# Patient Record
Sex: Male | Born: 1935 | Race: Black or African American | Hispanic: No | Marital: Single | Smoking: Former smoker
Health system: Southern US, Community
[De-identification: ages and names within clinical notes are randomized; demographics above are authoritative.]

## PROBLEM LIST (undated history)

## (undated) DIAGNOSIS — F0390 Unspecified dementia without behavioral disturbance: Secondary | ICD-10-CM

## (undated) DIAGNOSIS — N179 Acute kidney failure, unspecified: Secondary | ICD-10-CM

## (undated) DIAGNOSIS — I10 Essential (primary) hypertension: Secondary | ICD-10-CM

## (undated) DIAGNOSIS — I2510 Atherosclerotic heart disease of native coronary artery without angina pectoris: Secondary | ICD-10-CM

## (undated) HISTORY — DX: Acute kidney failure, unspecified: N17.9

## (undated) HISTORY — DX: Unspecified dementia, unspecified severity, without behavioral disturbance, psychotic disturbance, mood disturbance, and anxiety: F03.90

## (undated) HISTORY — DX: Essential (primary) hypertension: I10

## (undated) HISTORY — DX: Atherosclerotic heart disease of native coronary artery without angina pectoris: I25.10

---

## 2006-11-01 ENCOUNTER — Ambulatory Visit: Payer: Self-pay | Admitting: Cardiology

## 2006-11-09 ENCOUNTER — Ambulatory Visit (HOSPITAL_COMMUNITY): Admission: RE | Admit: 2006-11-09 | Discharge: 2006-11-09 | Payer: Self-pay | Admitting: Urology

## 2006-11-10 ENCOUNTER — Ambulatory Visit (HOSPITAL_COMMUNITY): Admission: RE | Admit: 2006-11-10 | Discharge: 2006-11-10 | Payer: Self-pay | Admitting: Urology

## 2009-10-14 ENCOUNTER — Ambulatory Visit (HOSPITAL_COMMUNITY): Admission: RE | Admit: 2009-10-14 | Discharge: 2009-10-14 | Payer: Self-pay | Admitting: Urology

## 2010-04-16 ENCOUNTER — Ambulatory Visit (HOSPITAL_COMMUNITY): Admission: RE | Admit: 2010-04-16 | Discharge: 2010-04-16 | Payer: Self-pay | Admitting: Urology

## 2010-10-20 ENCOUNTER — Ambulatory Visit (HOSPITAL_COMMUNITY)
Admission: RE | Admit: 2010-10-20 | Discharge: 2010-10-20 | Payer: Self-pay | Source: Home / Self Care | Attending: Urology | Admitting: Urology

## 2010-10-21 ENCOUNTER — Ambulatory Visit (HOSPITAL_COMMUNITY)
Admission: RE | Admit: 2010-10-21 | Discharge: 2010-10-21 | Payer: Self-pay | Source: Home / Self Care | Attending: Urology | Admitting: Urology

## 2010-11-03 ENCOUNTER — Other Ambulatory Visit (HOSPITAL_COMMUNITY): Payer: Self-pay | Admitting: Urology

## 2010-11-03 ENCOUNTER — Ambulatory Visit (HOSPITAL_COMMUNITY)
Admission: RE | Admit: 2010-11-03 | Discharge: 2010-11-03 | Disposition: A | Discharge status: Home / Self Care | Payer: Medicare Other | Source: Ambulatory Visit | Attending: Urology | Admitting: Urology

## 2010-11-03 DIAGNOSIS — R109 Unspecified abdominal pain: Secondary | ICD-10-CM | POA: Insufficient documentation

## 2010-11-03 DIAGNOSIS — N200 Calculus of kidney: Secondary | ICD-10-CM

## 2010-11-03 DIAGNOSIS — N201 Calculus of ureter: Secondary | ICD-10-CM | POA: Insufficient documentation

## 2010-11-18 ENCOUNTER — Ambulatory Visit (HOSPITAL_COMMUNITY)
Admission: RE | Admit: 2010-11-18 | Discharge: 2010-11-18 | Disposition: A | Discharge status: Home / Self Care | Payer: Medicare Other | Source: Ambulatory Visit | Attending: Urology | Admitting: Urology

## 2010-11-18 ENCOUNTER — Other Ambulatory Visit (HOSPITAL_COMMUNITY): Payer: Self-pay | Admitting: Urology

## 2010-11-18 DIAGNOSIS — Z09 Encounter for follow-up examination after completed treatment for conditions other than malignant neoplasm: Secondary | ICD-10-CM | POA: Insufficient documentation

## 2010-11-18 DIAGNOSIS — N201 Calculus of ureter: Secondary | ICD-10-CM | POA: Insufficient documentation

## 2010-12-17 ENCOUNTER — Other Ambulatory Visit (HOSPITAL_COMMUNITY): Payer: Self-pay | Admitting: Urology

## 2010-12-17 ENCOUNTER — Ambulatory Visit (HOSPITAL_COMMUNITY)
Admission: RE | Admit: 2010-12-17 | Discharge: 2010-12-17 | Disposition: A | Discharge status: Home / Self Care | Payer: Medicare Other | Source: Ambulatory Visit | Attending: Urology | Admitting: Urology

## 2010-12-17 DIAGNOSIS — Z9889 Other specified postprocedural states: Secondary | ICD-10-CM

## 2010-12-17 DIAGNOSIS — Z09 Encounter for follow-up examination after completed treatment for conditions other than malignant neoplasm: Secondary | ICD-10-CM | POA: Insufficient documentation

## 2010-12-17 DIAGNOSIS — N200 Calculus of kidney: Secondary | ICD-10-CM

## 2011-02-12 NOTE — H&P (Signed)
Joe Anderson, Joe Anderson                ACCOUNT NO.:  [id number]   MEDICAL RECORD NO.:  [id number]          PATIENT TYPE:  AMB   LOCATION:                                FACILITY:  [facility]   PHYSICIAN:  Dennie Maizes, M.D.   DATE OF BIRTH:  1928/09/11   DATE OF ADMISSION:  11/09/2006  DATE OF DISCHARGE:  LH                              HISTORY & PHYSICAL   CHIEF COMPLAINT:  Left flank pain, right ureteral calculus with  obstruction, bilateral renal calculi.   HISTORY OF PRESENT ILLNESS:  This 77 year old male is known to me from  prior evaluation and treatment.  He has a history of recurrent  urolithiasis.  He has undergone extracorporeal shock wave lithotripsy of  A large right renal calculus in 2001.   He came to the emergency room at [facility] with  intermittent left flank pain about 10 days ago.  Urinalysis revealed  microhematuria.  A noncontrast CT scan of the abdomen and pelvis  revealed bilateral large renal calculi without obstruction.  There was a  6.2-cm sized peripelvic cyst on the right kidney.  There was a 4.7-cm  sized left upper pole renal cyst with calcifications.  The patient had a  5-mm sized proximal left ureteral calculus with obstruction.  There was  also a 6-mm sized right upper ureteral calculus with obstruction and  hydronephrosis.  The patient was unable to pass the ureteral calculi.  He was taken to the O.R. on November 01, 2006.  Cystoscopy and bilateral  retrograde pyelograms were done.  There was a right upper ureteral  calculus with obstruction.  Right ureteral stent was placed.  The  patient was found to have passed the left ureteral calculus into the  bladder.  The bladder calculus was removed.  The patient is brought to  the short-stay center today for extracorporeal shock wave lithotripsy of  the obstructing right upper ureteral calculus.   PAST MEDICAL HISTORY:  1. History of scoliosis.  2. Recurrent urolithiasis.  3. Status post  right ureteral lithotomy 37 years ago, status post ESWL      of right renal calculus in 2001.   ALLERGIES:  The patient is allergic to IV CONTRAST.   FAMILY HISTORY:  Positive for hypertension, diabetes mellitus, recurrent  urolithiasis and prostate cancer.   EXAMINATION:  HEENT:  Normal.  NECK:  No masses.  LUNGS:  Clear to auscultation.  HEART:  Regular rate and rhythm.  No murmurs.  ABDOMEN:  Soft.  No palpable flank mass.  There is no costovertebral  angle tenderness.  Umbilical hernia is noted.  GENITOURINARY:  Penis and testes are normal.  RECTAL EXAMINATION:  Fifty-gram size benign prostate.   IMPRESSION:  Right ureteral calculus with obstruction, right  hydronephrosis, bilateral renal calculi,  bilateral renal cysts, status  post right ureteral stent placement.   PLAN:  Extracorporeal shock wave lithotripsy of the 6-mm sized right  ureteral calculus under IV sedation in the hospital.  I have informed  the patient regarding the diagnosis, operative details, alternative  treatments, outcome, possible risks and complications, and  he has agreed  for the procedure to be done.  The patient has multiple stones, and he  may require more than one session of treatment of ESWL.      Dennie Maizes, M.D.  Electronically Signed     SK/MEDQ  D:  11/09/2006  T:  11/09/2006  Job:  [id number]   cc:   Lia Hopping  Fax: [phone]

## 2011-03-18 ENCOUNTER — Ambulatory Visit (HOSPITAL_COMMUNITY)
Admission: RE | Admit: 2011-03-18 | Discharge: 2011-03-18 | Disposition: A | Discharge status: Home / Self Care | Payer: Medicare Other | Source: Ambulatory Visit | Attending: Urology | Admitting: Urology

## 2011-03-18 ENCOUNTER — Other Ambulatory Visit (HOSPITAL_COMMUNITY): Payer: Self-pay | Admitting: Urology

## 2011-03-18 DIAGNOSIS — R109 Unspecified abdominal pain: Secondary | ICD-10-CM | POA: Insufficient documentation

## 2011-03-18 DIAGNOSIS — N201 Calculus of ureter: Secondary | ICD-10-CM

## 2011-03-18 DIAGNOSIS — N200 Calculus of kidney: Secondary | ICD-10-CM | POA: Insufficient documentation

## 2011-07-22 ENCOUNTER — Other Ambulatory Visit (HOSPITAL_COMMUNITY): Payer: Self-pay | Admitting: Urology

## 2011-07-22 ENCOUNTER — Ambulatory Visit (HOSPITAL_COMMUNITY)
Admission: RE | Admit: 2011-07-22 | Discharge: 2011-07-22 | Disposition: A | Discharge status: Home / Self Care | Payer: Medicare Other | Source: Ambulatory Visit | Attending: Urology | Admitting: Urology

## 2011-07-22 DIAGNOSIS — N200 Calculus of kidney: Secondary | ICD-10-CM

## 2011-07-22 DIAGNOSIS — R109 Unspecified abdominal pain: Secondary | ICD-10-CM | POA: Insufficient documentation

## 2011-12-23 ENCOUNTER — Other Ambulatory Visit (HOSPITAL_COMMUNITY): Payer: Self-pay | Admitting: Urology

## 2011-12-23 ENCOUNTER — Ambulatory Visit (HOSPITAL_COMMUNITY)
Admission: RE | Admit: 2011-12-23 | Discharge: 2011-12-23 | Disposition: A | Discharge status: Home / Self Care | Payer: Medicare Other | Source: Ambulatory Visit | Attending: Urology | Admitting: Urology

## 2011-12-23 DIAGNOSIS — N200 Calculus of kidney: Secondary | ICD-10-CM | POA: Insufficient documentation

## 2011-12-23 DIAGNOSIS — M4120 Other idiopathic scoliosis, site unspecified: Secondary | ICD-10-CM | POA: Insufficient documentation

## 2014-09-27 HISTORY — PX: PERCUTANEOUS CORONARY STENT INTERVENTION (PCI-S): SHX6016

## 2015-06-06 DIAGNOSIS — I2510 Atherosclerotic heart disease of native coronary artery without angina pectoris: Secondary | ICD-10-CM | POA: Insufficient documentation

## 2015-06-06 DIAGNOSIS — R55 Syncope and collapse: Secondary | ICD-10-CM | POA: Insufficient documentation

## 2015-12-03 DIAGNOSIS — R55 Syncope and collapse: Secondary | ICD-10-CM | POA: Diagnosis not present

## 2015-12-03 DIAGNOSIS — Z959 Presence of cardiac and vascular implant and graft, unspecified: Secondary | ICD-10-CM | POA: Diagnosis not present

## 2015-12-04 DIAGNOSIS — R55 Syncope and collapse: Secondary | ICD-10-CM | POA: Diagnosis not present

## 2015-12-04 DIAGNOSIS — Z959 Presence of cardiac and vascular implant and graft, unspecified: Secondary | ICD-10-CM | POA: Diagnosis not present

## 2015-12-19 DIAGNOSIS — R630 Anorexia: Secondary | ICD-10-CM | POA: Diagnosis not present

## 2015-12-19 DIAGNOSIS — E119 Type 2 diabetes mellitus without complications: Secondary | ICD-10-CM | POA: Diagnosis not present

## 2015-12-19 DIAGNOSIS — I10 Essential (primary) hypertension: Secondary | ICD-10-CM | POA: Diagnosis not present

## 2015-12-19 DIAGNOSIS — Z6826 Body mass index (BMI) 26.0-26.9, adult: Secondary | ICD-10-CM | POA: Diagnosis not present

## 2015-12-19 DIAGNOSIS — N182 Chronic kidney disease, stage 2 (mild): Secondary | ICD-10-CM | POA: Diagnosis not present

## 2016-01-06 DIAGNOSIS — M5136 Other intervertebral disc degeneration, lumbar region: Secondary | ICD-10-CM | POA: Diagnosis not present

## 2016-01-06 DIAGNOSIS — M4727 Other spondylosis with radiculopathy, lumbosacral region: Secondary | ICD-10-CM | POA: Diagnosis not present

## 2016-01-20 DIAGNOSIS — Z87442 Personal history of urinary calculi: Secondary | ICD-10-CM | POA: Diagnosis not present

## 2016-01-20 DIAGNOSIS — M4806 Spinal stenosis, lumbar region: Secondary | ICD-10-CM | POA: Diagnosis not present

## 2016-01-20 DIAGNOSIS — M47816 Spondylosis without myelopathy or radiculopathy, lumbar region: Secondary | ICD-10-CM | POA: Diagnosis not present

## 2016-01-20 DIAGNOSIS — I2510 Atherosclerotic heart disease of native coronary artery without angina pectoris: Secondary | ICD-10-CM | POA: Diagnosis not present

## 2016-01-20 DIAGNOSIS — Z79899 Other long term (current) drug therapy: Secondary | ICD-10-CM | POA: Diagnosis not present

## 2016-01-20 DIAGNOSIS — E785 Hyperlipidemia, unspecified: Secondary | ICD-10-CM | POA: Diagnosis not present

## 2016-01-20 DIAGNOSIS — M5136 Other intervertebral disc degeneration, lumbar region: Secondary | ICD-10-CM | POA: Diagnosis not present

## 2016-01-20 DIAGNOSIS — M4727 Other spondylosis with radiculopathy, lumbosacral region: Secondary | ICD-10-CM | POA: Diagnosis not present

## 2016-01-20 DIAGNOSIS — I10 Essential (primary) hypertension: Secondary | ICD-10-CM | POA: Diagnosis not present

## 2016-01-20 DIAGNOSIS — Z955 Presence of coronary angioplasty implant and graft: Secondary | ICD-10-CM | POA: Diagnosis not present

## 2016-01-30 DIAGNOSIS — Z5181 Encounter for therapeutic drug level monitoring: Secondary | ICD-10-CM | POA: Diagnosis not present

## 2016-01-30 DIAGNOSIS — E119 Type 2 diabetes mellitus without complications: Secondary | ICD-10-CM | POA: Diagnosis not present

## 2016-01-30 DIAGNOSIS — I10 Essential (primary) hypertension: Secondary | ICD-10-CM | POA: Diagnosis not present

## 2016-01-30 DIAGNOSIS — R630 Anorexia: Secondary | ICD-10-CM | POA: Diagnosis not present

## 2016-02-02 DIAGNOSIS — R630 Anorexia: Secondary | ICD-10-CM | POA: Diagnosis not present

## 2016-02-02 DIAGNOSIS — E119 Type 2 diabetes mellitus without complications: Secondary | ICD-10-CM | POA: Diagnosis not present

## 2016-02-02 DIAGNOSIS — Z5181 Encounter for therapeutic drug level monitoring: Secondary | ICD-10-CM | POA: Diagnosis not present

## 2016-02-02 DIAGNOSIS — I10 Essential (primary) hypertension: Secondary | ICD-10-CM | POA: Diagnosis not present

## 2016-02-09 DIAGNOSIS — R630 Anorexia: Secondary | ICD-10-CM | POA: Diagnosis not present

## 2016-02-09 DIAGNOSIS — E119 Type 2 diabetes mellitus without complications: Secondary | ICD-10-CM | POA: Diagnosis not present

## 2016-02-09 DIAGNOSIS — Z5181 Encounter for therapeutic drug level monitoring: Secondary | ICD-10-CM | POA: Diagnosis not present

## 2016-02-09 DIAGNOSIS — I10 Essential (primary) hypertension: Secondary | ICD-10-CM | POA: Diagnosis not present

## 2016-02-16 DIAGNOSIS — R630 Anorexia: Secondary | ICD-10-CM | POA: Diagnosis not present

## 2016-02-16 DIAGNOSIS — Z5181 Encounter for therapeutic drug level monitoring: Secondary | ICD-10-CM | POA: Diagnosis not present

## 2016-02-16 DIAGNOSIS — I10 Essential (primary) hypertension: Secondary | ICD-10-CM | POA: Diagnosis not present

## 2016-02-16 DIAGNOSIS — E119 Type 2 diabetes mellitus without complications: Secondary | ICD-10-CM | POA: Diagnosis not present

## 2016-02-24 DIAGNOSIS — Z5181 Encounter for therapeutic drug level monitoring: Secondary | ICD-10-CM | POA: Diagnosis not present

## 2016-02-24 DIAGNOSIS — E119 Type 2 diabetes mellitus without complications: Secondary | ICD-10-CM | POA: Diagnosis not present

## 2016-02-24 DIAGNOSIS — I10 Essential (primary) hypertension: Secondary | ICD-10-CM | POA: Diagnosis not present

## 2016-02-24 DIAGNOSIS — R630 Anorexia: Secondary | ICD-10-CM | POA: Diagnosis not present

## 2016-03-01 DIAGNOSIS — Z5181 Encounter for therapeutic drug level monitoring: Secondary | ICD-10-CM | POA: Diagnosis not present

## 2016-03-01 DIAGNOSIS — R630 Anorexia: Secondary | ICD-10-CM | POA: Diagnosis not present

## 2016-03-01 DIAGNOSIS — I10 Essential (primary) hypertension: Secondary | ICD-10-CM | POA: Diagnosis not present

## 2016-03-01 DIAGNOSIS — E119 Type 2 diabetes mellitus without complications: Secondary | ICD-10-CM | POA: Diagnosis not present

## 2016-03-19 DIAGNOSIS — I10 Essential (primary) hypertension: Secondary | ICD-10-CM | POA: Diagnosis not present

## 2016-03-19 DIAGNOSIS — I25118 Atherosclerotic heart disease of native coronary artery with other forms of angina pectoris: Secondary | ICD-10-CM | POA: Diagnosis not present

## 2016-03-19 DIAGNOSIS — N182 Chronic kidney disease, stage 2 (mild): Secondary | ICD-10-CM | POA: Diagnosis not present

## 2016-03-19 DIAGNOSIS — Z6825 Body mass index (BMI) 25.0-25.9, adult: Secondary | ICD-10-CM | POA: Diagnosis not present

## 2016-03-19 DIAGNOSIS — E119 Type 2 diabetes mellitus without complications: Secondary | ICD-10-CM | POA: Diagnosis not present

## 2016-03-25 DIAGNOSIS — H35033 Hypertensive retinopathy, bilateral: Secondary | ICD-10-CM | POA: Diagnosis not present

## 2016-04-21 DIAGNOSIS — H25011 Cortical age-related cataract, right eye: Secondary | ICD-10-CM | POA: Diagnosis not present

## 2016-04-21 DIAGNOSIS — H35032 Hypertensive retinopathy, left eye: Secondary | ICD-10-CM | POA: Diagnosis not present

## 2016-04-21 DIAGNOSIS — H2512 Age-related nuclear cataract, left eye: Secondary | ICD-10-CM | POA: Diagnosis not present

## 2016-04-21 DIAGNOSIS — H25012 Cortical age-related cataract, left eye: Secondary | ICD-10-CM | POA: Diagnosis not present

## 2016-04-21 DIAGNOSIS — H35031 Hypertensive retinopathy, right eye: Secondary | ICD-10-CM | POA: Diagnosis not present

## 2016-04-21 DIAGNOSIS — H2511 Age-related nuclear cataract, right eye: Secondary | ICD-10-CM | POA: Diagnosis not present

## 2016-05-18 DIAGNOSIS — H2512 Age-related nuclear cataract, left eye: Secondary | ICD-10-CM | POA: Diagnosis not present

## 2016-05-18 DIAGNOSIS — H25812 Combined forms of age-related cataract, left eye: Secondary | ICD-10-CM | POA: Diagnosis not present

## 2016-05-18 DIAGNOSIS — H25012 Cortical age-related cataract, left eye: Secondary | ICD-10-CM | POA: Diagnosis not present

## 2016-06-07 DIAGNOSIS — H2511 Age-related nuclear cataract, right eye: Secondary | ICD-10-CM | POA: Diagnosis not present

## 2016-06-07 DIAGNOSIS — H25011 Cortical age-related cataract, right eye: Secondary | ICD-10-CM | POA: Diagnosis not present

## 2016-06-08 DIAGNOSIS — R69 Illness, unspecified: Secondary | ICD-10-CM | POA: Diagnosis not present

## 2016-06-15 DIAGNOSIS — H2511 Age-related nuclear cataract, right eye: Secondary | ICD-10-CM | POA: Diagnosis not present

## 2016-06-15 DIAGNOSIS — H25811 Combined forms of age-related cataract, right eye: Secondary | ICD-10-CM | POA: Diagnosis not present

## 2016-06-15 DIAGNOSIS — H25011 Cortical age-related cataract, right eye: Secondary | ICD-10-CM | POA: Diagnosis not present

## 2016-06-24 DIAGNOSIS — Z0000 Encounter for general adult medical examination without abnormal findings: Secondary | ICD-10-CM | POA: Diagnosis not present

## 2016-06-24 DIAGNOSIS — Z6826 Body mass index (BMI) 26.0-26.9, adult: Secondary | ICD-10-CM | POA: Diagnosis not present

## 2016-06-24 DIAGNOSIS — E119 Type 2 diabetes mellitus without complications: Secondary | ICD-10-CM | POA: Diagnosis not present

## 2016-06-24 DIAGNOSIS — I10 Essential (primary) hypertension: Secondary | ICD-10-CM | POA: Diagnosis not present

## 2016-06-24 DIAGNOSIS — I25118 Atherosclerotic heart disease of native coronary artery with other forms of angina pectoris: Secondary | ICD-10-CM | POA: Diagnosis not present

## 2016-06-24 DIAGNOSIS — Z1389 Encounter for screening for other disorder: Secondary | ICD-10-CM | POA: Diagnosis not present

## 2016-06-24 DIAGNOSIS — N182 Chronic kidney disease, stage 2 (mild): Secondary | ICD-10-CM | POA: Diagnosis not present

## 2016-06-24 DIAGNOSIS — R008 Other abnormalities of heart beat: Secondary | ICD-10-CM | POA: Diagnosis not present

## 2016-07-08 DIAGNOSIS — Z961 Presence of intraocular lens: Secondary | ICD-10-CM | POA: Diagnosis not present

## 2016-07-09 DIAGNOSIS — I10 Essential (primary) hypertension: Secondary | ICD-10-CM | POA: Insufficient documentation

## 2016-07-12 DIAGNOSIS — R55 Syncope and collapse: Secondary | ICD-10-CM | POA: Diagnosis not present

## 2016-07-12 DIAGNOSIS — I2510 Atherosclerotic heart disease of native coronary artery without angina pectoris: Secondary | ICD-10-CM | POA: Diagnosis not present

## 2016-07-12 DIAGNOSIS — Z95810 Presence of automatic (implantable) cardiac defibrillator: Secondary | ICD-10-CM | POA: Diagnosis not present

## 2016-07-12 DIAGNOSIS — Z4689 Encounter for fitting and adjustment of other specified devices: Secondary | ICD-10-CM | POA: Diagnosis not present

## 2016-07-12 DIAGNOSIS — I10 Essential (primary) hypertension: Secondary | ICD-10-CM | POA: Diagnosis not present

## 2016-07-15 DIAGNOSIS — Z961 Presence of intraocular lens: Secondary | ICD-10-CM | POA: Diagnosis not present

## 2016-07-15 DIAGNOSIS — H26492 Other secondary cataract, left eye: Secondary | ICD-10-CM | POA: Diagnosis not present

## 2016-07-15 DIAGNOSIS — H26491 Other secondary cataract, right eye: Secondary | ICD-10-CM | POA: Diagnosis not present

## 2016-07-15 DIAGNOSIS — H20012 Primary iridocyclitis, left eye: Secondary | ICD-10-CM | POA: Diagnosis not present

## 2016-08-23 DIAGNOSIS — Z79899 Other long term (current) drug therapy: Secondary | ICD-10-CM | POA: Diagnosis not present

## 2016-08-23 DIAGNOSIS — M5136 Other intervertebral disc degeneration, lumbar region: Secondary | ICD-10-CM | POA: Diagnosis not present

## 2016-08-23 DIAGNOSIS — M47816 Spondylosis without myelopathy or radiculopathy, lumbar region: Secondary | ICD-10-CM | POA: Diagnosis not present

## 2016-08-23 DIAGNOSIS — G8929 Other chronic pain: Secondary | ICD-10-CM | POA: Diagnosis not present

## 2016-08-30 DIAGNOSIS — R55 Syncope and collapse: Secondary | ICD-10-CM | POA: Diagnosis not present

## 2016-08-30 DIAGNOSIS — Z959 Presence of cardiac and vascular implant and graft, unspecified: Secondary | ICD-10-CM | POA: Diagnosis not present

## 2016-09-06 DIAGNOSIS — M4727 Other spondylosis with radiculopathy, lumbosacral region: Secondary | ICD-10-CM | POA: Diagnosis not present

## 2016-09-06 DIAGNOSIS — M5136 Other intervertebral disc degeneration, lumbar region: Secondary | ICD-10-CM | POA: Diagnosis not present

## 2016-09-16 DIAGNOSIS — M5136 Other intervertebral disc degeneration, lumbar region: Secondary | ICD-10-CM | POA: Diagnosis not present

## 2016-10-19 DIAGNOSIS — Z79891 Long term (current) use of opiate analgesic: Secondary | ICD-10-CM | POA: Diagnosis not present

## 2016-10-26 DIAGNOSIS — I25118 Atherosclerotic heart disease of native coronary artery with other forms of angina pectoris: Secondary | ICD-10-CM | POA: Diagnosis not present

## 2016-10-26 DIAGNOSIS — E119 Type 2 diabetes mellitus without complications: Secondary | ICD-10-CM | POA: Diagnosis not present

## 2016-10-26 DIAGNOSIS — Z6826 Body mass index (BMI) 26.0-26.9, adult: Secondary | ICD-10-CM | POA: Diagnosis not present

## 2016-10-26 DIAGNOSIS — N182 Chronic kidney disease, stage 2 (mild): Secondary | ICD-10-CM | POA: Diagnosis not present

## 2016-10-26 DIAGNOSIS — I10 Essential (primary) hypertension: Secondary | ICD-10-CM | POA: Diagnosis not present

## 2016-11-10 DIAGNOSIS — K5900 Constipation, unspecified: Secondary | ICD-10-CM | POA: Diagnosis not present

## 2016-11-10 DIAGNOSIS — R001 Bradycardia, unspecified: Secondary | ICD-10-CM | POA: Diagnosis not present

## 2016-11-10 DIAGNOSIS — N400 Enlarged prostate without lower urinary tract symptoms: Secondary | ICD-10-CM | POA: Diagnosis not present

## 2016-11-10 DIAGNOSIS — Z791 Long term (current) use of non-steroidal anti-inflammatories (NSAID): Secondary | ICD-10-CM | POA: Diagnosis not present

## 2016-11-10 DIAGNOSIS — Z9181 History of falling: Secondary | ICD-10-CM | POA: Diagnosis not present

## 2016-11-10 DIAGNOSIS — Z9849 Cataract extraction status, unspecified eye: Secondary | ICD-10-CM | POA: Diagnosis not present

## 2016-11-10 DIAGNOSIS — M419 Scoliosis, unspecified: Secondary | ICD-10-CM | POA: Diagnosis not present

## 2016-11-10 DIAGNOSIS — M1611 Unilateral primary osteoarthritis, right hip: Secondary | ICD-10-CM | POA: Diagnosis not present

## 2016-11-10 DIAGNOSIS — Z7982 Long term (current) use of aspirin: Secondary | ICD-10-CM | POA: Diagnosis not present

## 2016-11-10 DIAGNOSIS — I10 Essential (primary) hypertension: Secondary | ICD-10-CM | POA: Diagnosis not present

## 2016-11-10 DIAGNOSIS — Z0000 Encounter for general adult medical examination without abnormal findings: Secondary | ICD-10-CM | POA: Diagnosis not present

## 2016-11-16 DIAGNOSIS — M47816 Spondylosis without myelopathy or radiculopathy, lumbar region: Secondary | ICD-10-CM | POA: Diagnosis not present

## 2016-11-16 DIAGNOSIS — Z79891 Long term (current) use of opiate analgesic: Secondary | ICD-10-CM | POA: Diagnosis not present

## 2016-12-10 DIAGNOSIS — I2510 Atherosclerotic heart disease of native coronary artery without angina pectoris: Secondary | ICD-10-CM | POA: Diagnosis not present

## 2016-12-10 DIAGNOSIS — R55 Syncope and collapse: Secondary | ICD-10-CM | POA: Diagnosis not present

## 2016-12-10 DIAGNOSIS — Z959 Presence of cardiac and vascular implant and graft, unspecified: Secondary | ICD-10-CM | POA: Diagnosis not present

## 2016-12-10 DIAGNOSIS — Z4509 Encounter for adjustment and management of other cardiac device: Secondary | ICD-10-CM | POA: Diagnosis not present

## 2016-12-10 DIAGNOSIS — I10 Essential (primary) hypertension: Secondary | ICD-10-CM | POA: Diagnosis not present

## 2017-01-06 DIAGNOSIS — H35033 Hypertensive retinopathy, bilateral: Secondary | ICD-10-CM | POA: Diagnosis not present

## 2017-01-06 DIAGNOSIS — H35371 Puckering of macula, right eye: Secondary | ICD-10-CM | POA: Diagnosis not present

## 2017-01-06 DIAGNOSIS — I10 Essential (primary) hypertension: Secondary | ICD-10-CM | POA: Diagnosis not present

## 2017-01-06 DIAGNOSIS — Z961 Presence of intraocular lens: Secondary | ICD-10-CM | POA: Diagnosis not present

## 2017-01-06 DIAGNOSIS — H26493 Other secondary cataract, bilateral: Secondary | ICD-10-CM | POA: Diagnosis not present

## 2017-01-06 DIAGNOSIS — H40013 Open angle with borderline findings, low risk, bilateral: Secondary | ICD-10-CM | POA: Diagnosis not present

## 2017-01-06 DIAGNOSIS — H35362 Drusen (degenerative) of macula, left eye: Secondary | ICD-10-CM | POA: Diagnosis not present

## 2017-01-06 DIAGNOSIS — H18413 Arcus senilis, bilateral: Secondary | ICD-10-CM | POA: Diagnosis not present

## 2017-01-27 DIAGNOSIS — I25118 Atherosclerotic heart disease of native coronary artery with other forms of angina pectoris: Secondary | ICD-10-CM | POA: Diagnosis not present

## 2017-01-27 DIAGNOSIS — E119 Type 2 diabetes mellitus without complications: Secondary | ICD-10-CM | POA: Diagnosis not present

## 2017-01-27 DIAGNOSIS — I10 Essential (primary) hypertension: Secondary | ICD-10-CM | POA: Diagnosis not present

## 2017-01-27 DIAGNOSIS — N182 Chronic kidney disease, stage 2 (mild): Secondary | ICD-10-CM | POA: Diagnosis not present

## 2017-01-27 DIAGNOSIS — Z6826 Body mass index (BMI) 26.0-26.9, adult: Secondary | ICD-10-CM | POA: Diagnosis not present

## 2017-04-28 DIAGNOSIS — S4382XA Sprain of other specified parts of left shoulder girdle, initial encounter: Secondary | ICD-10-CM | POA: Diagnosis not present

## 2017-05-06 DIAGNOSIS — G8929 Other chronic pain: Secondary | ICD-10-CM | POA: Diagnosis not present

## 2017-05-06 DIAGNOSIS — M25551 Pain in right hip: Secondary | ICD-10-CM | POA: Diagnosis not present

## 2017-05-06 DIAGNOSIS — Z79899 Other long term (current) drug therapy: Secondary | ICD-10-CM | POA: Diagnosis not present

## 2017-05-10 DIAGNOSIS — Z6826 Body mass index (BMI) 26.0-26.9, adult: Secondary | ICD-10-CM | POA: Diagnosis not present

## 2017-05-10 DIAGNOSIS — I10 Essential (primary) hypertension: Secondary | ICD-10-CM | POA: Diagnosis not present

## 2017-05-10 DIAGNOSIS — N182 Chronic kidney disease, stage 2 (mild): Secondary | ICD-10-CM | POA: Diagnosis not present

## 2017-05-10 DIAGNOSIS — M7061 Trochanteric bursitis, right hip: Secondary | ICD-10-CM | POA: Diagnosis not present

## 2017-05-10 DIAGNOSIS — E119 Type 2 diabetes mellitus without complications: Secondary | ICD-10-CM | POA: Diagnosis not present

## 2017-05-10 DIAGNOSIS — I25118 Atherosclerotic heart disease of native coronary artery with other forms of angina pectoris: Secondary | ICD-10-CM | POA: Diagnosis not present

## 2017-05-27 DIAGNOSIS — Z4689 Encounter for fitting and adjustment of other specified devices: Secondary | ICD-10-CM | POA: Diagnosis not present

## 2017-05-27 DIAGNOSIS — R001 Bradycardia, unspecified: Secondary | ICD-10-CM | POA: Diagnosis not present

## 2017-05-27 DIAGNOSIS — R55 Syncope and collapse: Secondary | ICD-10-CM | POA: Diagnosis not present

## 2017-05-27 DIAGNOSIS — I2510 Atherosclerotic heart disease of native coronary artery without angina pectoris: Secondary | ICD-10-CM | POA: Diagnosis not present

## 2017-06-06 DIAGNOSIS — G8929 Other chronic pain: Secondary | ICD-10-CM | POA: Diagnosis not present

## 2017-06-06 DIAGNOSIS — Z79899 Other long term (current) drug therapy: Secondary | ICD-10-CM | POA: Diagnosis not present

## 2017-06-06 DIAGNOSIS — M25551 Pain in right hip: Secondary | ICD-10-CM | POA: Diagnosis not present

## 2017-06-22 ENCOUNTER — Other Ambulatory Visit: Payer: Self-pay | Admitting: Internal Medicine

## 2017-06-22 DIAGNOSIS — G8929 Other chronic pain: Secondary | ICD-10-CM

## 2017-06-22 DIAGNOSIS — M545 Low back pain, unspecified: Secondary | ICD-10-CM

## 2017-07-11 ENCOUNTER — Ambulatory Visit
Admission: RE | Admit: 2017-07-11 | Discharge: 2017-07-11 | Disposition: A | Discharge status: Home / Self Care | Payer: Medicare HMO | Source: Ambulatory Visit | Attending: Internal Medicine | Admitting: Internal Medicine

## 2017-07-11 DIAGNOSIS — M47817 Spondylosis without myelopathy or radiculopathy, lumbosacral region: Secondary | ICD-10-CM | POA: Diagnosis not present

## 2017-07-11 DIAGNOSIS — M545 Low back pain, unspecified: Secondary | ICD-10-CM

## 2017-07-11 DIAGNOSIS — G8929 Other chronic pain: Secondary | ICD-10-CM

## 2017-07-11 MED ORDER — METHYLPREDNISOLONE ACETATE 40 MG/ML INJ SUSP (RADIOLOG
120.0000 mg | Freq: Once | INTRAMUSCULAR | Status: AC
  Administered 2017-07-11: 120 mg via EPIDURAL

## 2017-07-11 MED ORDER — IOPAMIDOL (ISOVUE-M 200) INJECTION 41%
1.0000 mL | Freq: Once | INTRAMUSCULAR | Status: AC
  Administered 2017-07-11: 1 mL via EPIDURAL

## 2017-07-11 NOTE — Discharge Instructions (Signed)

## 2017-07-13 DIAGNOSIS — M24651 Ankylosis, right hip: Secondary | ICD-10-CM | POA: Diagnosis not present

## 2017-07-13 DIAGNOSIS — N182 Chronic kidney disease, stage 2 (mild): Secondary | ICD-10-CM | POA: Diagnosis not present

## 2017-07-13 DIAGNOSIS — M7061 Trochanteric bursitis, right hip: Secondary | ICD-10-CM | POA: Diagnosis not present

## 2017-07-13 DIAGNOSIS — E119 Type 2 diabetes mellitus without complications: Secondary | ICD-10-CM | POA: Diagnosis not present

## 2017-07-13 DIAGNOSIS — Z6827 Body mass index (BMI) 27.0-27.9, adult: Secondary | ICD-10-CM | POA: Diagnosis not present

## 2017-07-13 DIAGNOSIS — I10 Essential (primary) hypertension: Secondary | ICD-10-CM | POA: Diagnosis not present

## 2017-07-13 DIAGNOSIS — I25118 Atherosclerotic heart disease of native coronary artery with other forms of angina pectoris: Secondary | ICD-10-CM | POA: Diagnosis not present

## 2017-07-19 DIAGNOSIS — R69 Illness, unspecified: Secondary | ICD-10-CM | POA: Diagnosis not present

## 2017-09-12 DIAGNOSIS — I25118 Atherosclerotic heart disease of native coronary artery with other forms of angina pectoris: Secondary | ICD-10-CM | POA: Diagnosis not present

## 2017-09-12 DIAGNOSIS — E119 Type 2 diabetes mellitus without complications: Secondary | ICD-10-CM | POA: Diagnosis not present

## 2017-09-12 DIAGNOSIS — I10 Essential (primary) hypertension: Secondary | ICD-10-CM | POA: Diagnosis not present

## 2017-09-12 DIAGNOSIS — M545 Low back pain: Secondary | ICD-10-CM | POA: Diagnosis not present

## 2017-09-12 DIAGNOSIS — N182 Chronic kidney disease, stage 2 (mild): Secondary | ICD-10-CM | POA: Diagnosis not present

## 2017-09-12 DIAGNOSIS — Z6827 Body mass index (BMI) 27.0-27.9, adult: Secondary | ICD-10-CM | POA: Diagnosis not present

## 2017-11-15 DIAGNOSIS — I10 Essential (primary) hypertension: Secondary | ICD-10-CM | POA: Diagnosis not present

## 2017-11-15 DIAGNOSIS — I25118 Atherosclerotic heart disease of native coronary artery with other forms of angina pectoris: Secondary | ICD-10-CM | POA: Diagnosis not present

## 2017-11-15 DIAGNOSIS — E119 Type 2 diabetes mellitus without complications: Secondary | ICD-10-CM | POA: Diagnosis not present

## 2017-11-15 DIAGNOSIS — N182 Chronic kidney disease, stage 2 (mild): Secondary | ICD-10-CM | POA: Diagnosis not present

## 2017-11-15 DIAGNOSIS — Z6827 Body mass index (BMI) 27.0-27.9, adult: Secondary | ICD-10-CM | POA: Diagnosis not present

## 2017-11-15 DIAGNOSIS — M545 Low back pain: Secondary | ICD-10-CM | POA: Diagnosis not present

## 2017-12-26 ENCOUNTER — Other Ambulatory Visit: Payer: Self-pay | Admitting: Internal Medicine

## 2017-12-26 DIAGNOSIS — G8929 Other chronic pain: Secondary | ICD-10-CM

## 2017-12-26 DIAGNOSIS — M545 Low back pain: Principal | ICD-10-CM

## 2018-01-04 DIAGNOSIS — I10 Essential (primary) hypertension: Secondary | ICD-10-CM | POA: Diagnosis not present

## 2018-01-04 DIAGNOSIS — I69328 Other speech and language deficits following cerebral infarction: Secondary | ICD-10-CM | POA: Diagnosis not present

## 2018-01-04 DIAGNOSIS — G8929 Other chronic pain: Secondary | ICD-10-CM | POA: Diagnosis not present

## 2018-01-04 DIAGNOSIS — R69 Illness, unspecified: Secondary | ICD-10-CM | POA: Diagnosis not present

## 2018-01-04 DIAGNOSIS — I2510 Atherosclerotic heart disease of native coronary artery without angina pectoris: Secondary | ICD-10-CM | POA: Diagnosis not present

## 2018-01-04 DIAGNOSIS — E785 Hyperlipidemia, unspecified: Secondary | ICD-10-CM | POA: Diagnosis not present

## 2018-01-04 DIAGNOSIS — I69351 Hemiplegia and hemiparesis following cerebral infarction affecting right dominant side: Secondary | ICD-10-CM | POA: Diagnosis not present

## 2018-01-04 DIAGNOSIS — I499 Cardiac arrhythmia, unspecified: Secondary | ICD-10-CM | POA: Diagnosis not present

## 2018-01-19 ENCOUNTER — Ambulatory Visit
Admission: RE | Admit: 2018-01-19 | Discharge: 2018-01-19 | Disposition: A | Discharge status: Home / Self Care | Payer: Medicare HMO | Source: Ambulatory Visit | Attending: Internal Medicine | Admitting: Internal Medicine

## 2018-01-19 DIAGNOSIS — M545 Low back pain: Secondary | ICD-10-CM | POA: Diagnosis not present

## 2018-01-19 DIAGNOSIS — G8929 Other chronic pain: Secondary | ICD-10-CM

## 2018-01-19 MED ORDER — METHYLPREDNISOLONE ACETATE 40 MG/ML INJ SUSP (RADIOLOG
120.0000 mg | Freq: Once | INTRAMUSCULAR | Status: AC
  Administered 2018-01-19: 120 mg via EPIDURAL

## 2018-01-19 MED ORDER — IOPAMIDOL (ISOVUE-M 200) INJECTION 41%
1.0000 mL | Freq: Once | INTRAMUSCULAR | Status: AC
  Administered 2018-01-19: 1 mL via EPIDURAL

## 2018-01-19 NOTE — Discharge Instructions (Signed)

## 2018-01-25 DIAGNOSIS — L401 Generalized pustular psoriasis: Secondary | ICD-10-CM | POA: Diagnosis not present

## 2018-01-30 DIAGNOSIS — C20 Malignant neoplasm of rectum: Secondary | ICD-10-CM | POA: Diagnosis not present

## 2018-01-30 DIAGNOSIS — Z85038 Personal history of other malignant neoplasm of large intestine: Secondary | ICD-10-CM | POA: Diagnosis not present

## 2018-01-30 DIAGNOSIS — C180 Malignant neoplasm of cecum: Secondary | ICD-10-CM | POA: Diagnosis not present

## 2018-02-06 DIAGNOSIS — Z6826 Body mass index (BMI) 26.0-26.9, adult: Secondary | ICD-10-CM | POA: Diagnosis not present

## 2018-02-06 DIAGNOSIS — E119 Type 2 diabetes mellitus without complications: Secondary | ICD-10-CM | POA: Diagnosis not present

## 2018-02-06 DIAGNOSIS — M545 Low back pain: Secondary | ICD-10-CM | POA: Diagnosis not present

## 2018-02-06 DIAGNOSIS — I10 Essential (primary) hypertension: Secondary | ICD-10-CM | POA: Diagnosis not present

## 2018-02-06 DIAGNOSIS — N182 Chronic kidney disease, stage 2 (mild): Secondary | ICD-10-CM | POA: Diagnosis not present

## 2018-02-16 DIAGNOSIS — E875 Hyperkalemia: Secondary | ICD-10-CM | POA: Diagnosis not present

## 2018-02-16 DIAGNOSIS — I129 Hypertensive chronic kidney disease with stage 1 through stage 4 chronic kidney disease, or unspecified chronic kidney disease: Secondary | ICD-10-CM | POA: Diagnosis not present

## 2018-02-16 DIAGNOSIS — R0602 Shortness of breath: Secondary | ICD-10-CM | POA: Diagnosis not present

## 2018-02-16 DIAGNOSIS — J9811 Atelectasis: Secondary | ICD-10-CM | POA: Diagnosis not present

## 2018-02-16 DIAGNOSIS — R05 Cough: Secondary | ICD-10-CM | POA: Diagnosis not present

## 2018-02-16 DIAGNOSIS — Z79899 Other long term (current) drug therapy: Secondary | ICD-10-CM | POA: Diagnosis not present

## 2018-02-16 DIAGNOSIS — N189 Chronic kidney disease, unspecified: Secondary | ICD-10-CM | POA: Diagnosis not present

## 2018-02-16 DIAGNOSIS — M1990 Unspecified osteoarthritis, unspecified site: Secondary | ICD-10-CM | POA: Diagnosis not present

## 2018-02-16 DIAGNOSIS — R0600 Dyspnea, unspecified: Secondary | ICD-10-CM | POA: Diagnosis not present

## 2018-02-16 DIAGNOSIS — I4891 Unspecified atrial fibrillation: Secondary | ICD-10-CM | POA: Diagnosis not present

## 2018-02-16 DIAGNOSIS — M419 Scoliosis, unspecified: Secondary | ICD-10-CM | POA: Diagnosis not present

## 2018-02-25 DIAGNOSIS — L401 Generalized pustular psoriasis: Secondary | ICD-10-CM | POA: Diagnosis not present

## 2018-03-15 DIAGNOSIS — I10 Essential (primary) hypertension: Secondary | ICD-10-CM | POA: Diagnosis not present

## 2018-03-15 DIAGNOSIS — Z1389 Encounter for screening for other disorder: Secondary | ICD-10-CM | POA: Diagnosis not present

## 2018-03-15 DIAGNOSIS — E119 Type 2 diabetes mellitus without complications: Secondary | ICD-10-CM | POA: Diagnosis not present

## 2018-03-15 DIAGNOSIS — Z0000 Encounter for general adult medical examination without abnormal findings: Secondary | ICD-10-CM | POA: Diagnosis not present

## 2018-03-15 DIAGNOSIS — N182 Chronic kidney disease, stage 2 (mild): Secondary | ICD-10-CM | POA: Diagnosis not present

## 2018-03-15 DIAGNOSIS — Z6826 Body mass index (BMI) 26.0-26.9, adult: Secondary | ICD-10-CM | POA: Diagnosis not present

## 2018-03-15 DIAGNOSIS — M545 Low back pain: Secondary | ICD-10-CM | POA: Diagnosis not present

## 2018-03-17 DIAGNOSIS — L401 Generalized pustular psoriasis: Secondary | ICD-10-CM | POA: Diagnosis not present

## 2018-03-19 DIAGNOSIS — R141 Gas pain: Secondary | ICD-10-CM | POA: Diagnosis not present

## 2018-03-19 DIAGNOSIS — I10 Essential (primary) hypertension: Secondary | ICD-10-CM | POA: Diagnosis not present

## 2018-03-19 DIAGNOSIS — K5900 Constipation, unspecified: Secondary | ICD-10-CM | POA: Diagnosis not present

## 2018-03-19 DIAGNOSIS — K921 Melena: Secondary | ICD-10-CM | POA: Diagnosis not present

## 2018-03-19 DIAGNOSIS — R52 Pain, unspecified: Secondary | ICD-10-CM | POA: Diagnosis not present

## 2018-03-19 DIAGNOSIS — K51411 Inflammatory polyps of colon with rectal bleeding: Secondary | ICD-10-CM | POA: Diagnosis not present

## 2018-03-19 DIAGNOSIS — R102 Pelvic and perineal pain: Secondary | ICD-10-CM | POA: Diagnosis not present

## 2018-03-19 DIAGNOSIS — R112 Nausea with vomiting, unspecified: Secondary | ICD-10-CM | POA: Diagnosis not present

## 2018-03-19 DIAGNOSIS — R194 Change in bowel habit: Secondary | ICD-10-CM | POA: Diagnosis not present

## 2018-03-19 DIAGNOSIS — R69 Illness, unspecified: Secondary | ICD-10-CM | POA: Diagnosis not present

## 2018-04-12 ENCOUNTER — Other Ambulatory Visit: Payer: Self-pay | Admitting: Internal Medicine

## 2018-04-12 DIAGNOSIS — G8929 Other chronic pain: Secondary | ICD-10-CM

## 2018-04-12 DIAGNOSIS — M545 Low back pain: Principal | ICD-10-CM

## 2018-04-17 DIAGNOSIS — M85851 Other specified disorders of bone density and structure, right thigh: Secondary | ICD-10-CM | POA: Diagnosis not present

## 2018-04-17 DIAGNOSIS — M810 Age-related osteoporosis without current pathological fracture: Secondary | ICD-10-CM | POA: Diagnosis not present

## 2018-04-24 ENCOUNTER — Ambulatory Visit
Admission: RE | Admit: 2018-04-24 | Discharge: 2018-04-24 | Disposition: A | Discharge status: Home / Self Care | Payer: Medicare HMO | Source: Ambulatory Visit | Attending: Internal Medicine | Admitting: Internal Medicine

## 2018-04-24 DIAGNOSIS — M5136 Other intervertebral disc degeneration, lumbar region: Secondary | ICD-10-CM | POA: Diagnosis not present

## 2018-04-24 DIAGNOSIS — M545 Low back pain: Principal | ICD-10-CM

## 2018-04-24 DIAGNOSIS — G8929 Other chronic pain: Secondary | ICD-10-CM

## 2018-04-24 MED ORDER — METHYLPREDNISOLONE ACETATE 40 MG/ML INJ SUSP (RADIOLOG
120.0000 mg | Freq: Once | INTRAMUSCULAR | Status: AC
  Administered 2018-04-24: 120 mg via EPIDURAL

## 2018-04-24 MED ORDER — IOPAMIDOL (ISOVUE-M 200) INJECTION 41%
1.0000 mL | Freq: Once | INTRAMUSCULAR | Status: AC
  Administered 2018-04-24: 1 mL via EPIDURAL

## 2018-05-17 DIAGNOSIS — N182 Chronic kidney disease, stage 2 (mild): Secondary | ICD-10-CM | POA: Diagnosis not present

## 2018-05-17 DIAGNOSIS — Z6825 Body mass index (BMI) 25.0-25.9, adult: Secondary | ICD-10-CM | POA: Diagnosis not present

## 2018-05-17 DIAGNOSIS — M545 Low back pain: Secondary | ICD-10-CM | POA: Diagnosis not present

## 2018-05-17 DIAGNOSIS — E119 Type 2 diabetes mellitus without complications: Secondary | ICD-10-CM | POA: Diagnosis not present

## 2018-05-17 DIAGNOSIS — I10 Essential (primary) hypertension: Secondary | ICD-10-CM | POA: Diagnosis not present

## 2018-05-25 DIAGNOSIS — H524 Presbyopia: Secondary | ICD-10-CM | POA: Diagnosis not present

## 2018-05-25 DIAGNOSIS — H40053 Ocular hypertension, bilateral: Secondary | ICD-10-CM | POA: Diagnosis not present

## 2018-05-25 DIAGNOSIS — H35371 Puckering of macula, right eye: Secondary | ICD-10-CM | POA: Diagnosis not present

## 2018-07-21 DIAGNOSIS — R55 Syncope and collapse: Secondary | ICD-10-CM | POA: Diagnosis not present

## 2018-07-21 DIAGNOSIS — I10 Essential (primary) hypertension: Secondary | ICD-10-CM | POA: Diagnosis not present

## 2018-07-21 DIAGNOSIS — I2510 Atherosclerotic heart disease of native coronary artery without angina pectoris: Secondary | ICD-10-CM | POA: Diagnosis not present

## 2018-07-24 DIAGNOSIS — R69 Illness, unspecified: Secondary | ICD-10-CM | POA: Diagnosis not present

## 2018-07-26 ENCOUNTER — Other Ambulatory Visit: Payer: Self-pay | Admitting: Internal Medicine

## 2018-07-26 DIAGNOSIS — M545 Low back pain, unspecified: Secondary | ICD-10-CM

## 2018-07-26 DIAGNOSIS — E119 Type 2 diabetes mellitus without complications: Secondary | ICD-10-CM | POA: Diagnosis not present

## 2018-07-26 DIAGNOSIS — N182 Chronic kidney disease, stage 2 (mild): Secondary | ICD-10-CM | POA: Diagnosis not present

## 2018-07-26 DIAGNOSIS — G8929 Other chronic pain: Secondary | ICD-10-CM

## 2018-07-26 DIAGNOSIS — I10 Essential (primary) hypertension: Secondary | ICD-10-CM | POA: Diagnosis not present

## 2018-07-26 DIAGNOSIS — Z6826 Body mass index (BMI) 26.0-26.9, adult: Secondary | ICD-10-CM | POA: Diagnosis not present

## 2018-08-03 ENCOUNTER — Inpatient Hospital Stay
Admission: RE | Admit: 2018-08-03 | Discharge: 2018-08-03 | Disposition: A | Discharge status: Home / Self Care | Payer: Medicare HMO | Source: Ambulatory Visit | Attending: Internal Medicine | Admitting: Internal Medicine

## 2018-08-11 ENCOUNTER — Ambulatory Visit
Admission: RE | Admit: 2018-08-11 | Discharge: 2018-08-11 | Disposition: A | Discharge status: Home / Self Care | Payer: Medicare HMO | Source: Ambulatory Visit | Attending: Internal Medicine | Admitting: Internal Medicine

## 2018-08-11 DIAGNOSIS — M545 Low back pain, unspecified: Secondary | ICD-10-CM

## 2018-08-11 DIAGNOSIS — M47817 Spondylosis without myelopathy or radiculopathy, lumbosacral region: Secondary | ICD-10-CM | POA: Diagnosis not present

## 2018-08-11 DIAGNOSIS — G8929 Other chronic pain: Secondary | ICD-10-CM

## 2018-08-11 MED ORDER — IOPAMIDOL (ISOVUE-M 200) INJECTION 41%
1.0000 mL | Freq: Once | INTRAMUSCULAR | Status: AC
  Administered 2018-08-11: 1 mL via EPIDURAL

## 2018-08-11 MED ORDER — METHYLPREDNISOLONE ACETATE 40 MG/ML INJ SUSP (RADIOLOG
120.0000 mg | Freq: Once | INTRAMUSCULAR | Status: AC
  Administered 2018-08-11: 120 mg via EPIDURAL

## 2018-08-11 NOTE — Discharge Instructions (Signed)

## 2018-08-22 DIAGNOSIS — Z79899 Other long term (current) drug therapy: Secondary | ICD-10-CM | POA: Diagnosis not present

## 2018-08-22 DIAGNOSIS — Z8673 Personal history of transient ischemic attack (TIA), and cerebral infarction without residual deficits: Secondary | ICD-10-CM | POA: Diagnosis not present

## 2018-08-22 DIAGNOSIS — Z87891 Personal history of nicotine dependence: Secondary | ICD-10-CM | POA: Diagnosis not present

## 2018-08-22 DIAGNOSIS — Z955 Presence of coronary angioplasty implant and graft: Secondary | ICD-10-CM | POA: Diagnosis not present

## 2018-08-22 DIAGNOSIS — E119 Type 2 diabetes mellitus without complications: Secondary | ICD-10-CM | POA: Diagnosis not present

## 2018-08-22 DIAGNOSIS — I10 Essential (primary) hypertension: Secondary | ICD-10-CM | POA: Diagnosis not present

## 2018-08-22 DIAGNOSIS — Z4509 Encounter for adjustment and management of other cardiac device: Secondary | ICD-10-CM | POA: Diagnosis not present

## 2018-08-22 DIAGNOSIS — R55 Syncope and collapse: Secondary | ICD-10-CM | POA: Diagnosis not present

## 2018-08-22 DIAGNOSIS — I2510 Atherosclerotic heart disease of native coronary artery without angina pectoris: Secondary | ICD-10-CM | POA: Diagnosis not present

## 2018-08-22 DIAGNOSIS — Z791 Long term (current) use of non-steroidal anti-inflammatories (NSAID): Secondary | ICD-10-CM | POA: Diagnosis not present

## 2018-10-17 DIAGNOSIS — I10 Essential (primary) hypertension: Secondary | ICD-10-CM | POA: Diagnosis not present

## 2018-10-17 DIAGNOSIS — I2510 Atherosclerotic heart disease of native coronary artery without angina pectoris: Secondary | ICD-10-CM | POA: Diagnosis not present

## 2018-10-17 DIAGNOSIS — R55 Syncope and collapse: Secondary | ICD-10-CM | POA: Diagnosis not present

## 2018-10-20 DIAGNOSIS — I10 Essential (primary) hypertension: Secondary | ICD-10-CM | POA: Diagnosis not present

## 2018-10-20 DIAGNOSIS — R55 Syncope and collapse: Secondary | ICD-10-CM | POA: Diagnosis not present

## 2018-10-20 DIAGNOSIS — I2510 Atherosclerotic heart disease of native coronary artery without angina pectoris: Secondary | ICD-10-CM | POA: Diagnosis not present

## 2018-11-28 DIAGNOSIS — N183 Chronic kidney disease, stage 3 (moderate): Secondary | ICD-10-CM | POA: Diagnosis not present

## 2018-11-28 DIAGNOSIS — I10 Essential (primary) hypertension: Secondary | ICD-10-CM | POA: Diagnosis not present

## 2018-11-28 DIAGNOSIS — M545 Low back pain: Secondary | ICD-10-CM | POA: Diagnosis not present

## 2018-11-28 DIAGNOSIS — Z6825 Body mass index (BMI) 25.0-25.9, adult: Secondary | ICD-10-CM | POA: Diagnosis not present

## 2018-11-28 DIAGNOSIS — E1121 Type 2 diabetes mellitus with diabetic nephropathy: Secondary | ICD-10-CM | POA: Diagnosis not present

## 2018-12-01 ENCOUNTER — Other Ambulatory Visit: Payer: Self-pay | Admitting: Internal Medicine

## 2018-12-01 DIAGNOSIS — H40053 Ocular hypertension, bilateral: Secondary | ICD-10-CM | POA: Diagnosis not present

## 2018-12-01 DIAGNOSIS — G8929 Other chronic pain: Secondary | ICD-10-CM

## 2018-12-01 DIAGNOSIS — M545 Low back pain, unspecified: Secondary | ICD-10-CM

## 2018-12-01 DIAGNOSIS — Z6825 Body mass index (BMI) 25.0-25.9, adult: Secondary | ICD-10-CM | POA: Diagnosis not present

## 2018-12-01 DIAGNOSIS — N183 Chronic kidney disease, stage 3 (moderate): Secondary | ICD-10-CM | POA: Diagnosis not present

## 2018-12-01 DIAGNOSIS — I10 Essential (primary) hypertension: Secondary | ICD-10-CM | POA: Diagnosis not present

## 2018-12-01 DIAGNOSIS — E1121 Type 2 diabetes mellitus with diabetic nephropathy: Secondary | ICD-10-CM | POA: Diagnosis not present

## 2018-12-04 DIAGNOSIS — I509 Heart failure, unspecified: Secondary | ICD-10-CM | POA: Diagnosis not present

## 2018-12-04 DIAGNOSIS — M1990 Unspecified osteoarthritis, unspecified site: Secondary | ICD-10-CM | POA: Diagnosis not present

## 2018-12-04 DIAGNOSIS — I110 Hypertensive heart disease with heart failure: Secondary | ICD-10-CM | POA: Diagnosis not present

## 2018-12-04 DIAGNOSIS — G8929 Other chronic pain: Secondary | ICD-10-CM | POA: Diagnosis not present

## 2018-12-04 DIAGNOSIS — Z79891 Long term (current) use of opiate analgesic: Secondary | ICD-10-CM | POA: Diagnosis not present

## 2018-12-04 DIAGNOSIS — I69351 Hemiplegia and hemiparesis following cerebral infarction affecting right dominant side: Secondary | ICD-10-CM | POA: Diagnosis not present

## 2018-12-04 DIAGNOSIS — N400 Enlarged prostate without lower urinary tract symptoms: Secondary | ICD-10-CM | POA: Diagnosis not present

## 2018-12-04 DIAGNOSIS — E785 Hyperlipidemia, unspecified: Secondary | ICD-10-CM | POA: Diagnosis not present

## 2018-12-04 DIAGNOSIS — R69 Illness, unspecified: Secondary | ICD-10-CM | POA: Diagnosis not present

## 2018-12-07 ENCOUNTER — Ambulatory Visit
Admission: RE | Admit: 2018-12-07 | Discharge: 2018-12-07 | Disposition: A | Discharge status: Home / Self Care | Payer: Medicare HMO | Source: Ambulatory Visit | Attending: Internal Medicine | Admitting: Internal Medicine

## 2018-12-07 DIAGNOSIS — G8929 Other chronic pain: Secondary | ICD-10-CM

## 2018-12-07 DIAGNOSIS — M545 Low back pain, unspecified: Secondary | ICD-10-CM

## 2018-12-07 DIAGNOSIS — M419 Scoliosis, unspecified: Secondary | ICD-10-CM | POA: Diagnosis not present

## 2018-12-07 MED ORDER — METHYLPREDNISOLONE ACETATE 40 MG/ML INJ SUSP (RADIOLOG
120.0000 mg | Freq: Once | INTRAMUSCULAR | Status: AC
  Administered 2018-12-07: 120 mg via EPIDURAL

## 2018-12-07 MED ORDER — IOPAMIDOL (ISOVUE-M 200) INJECTION 41%
1.0000 mL | Freq: Once | INTRAMUSCULAR | Status: AC
  Administered 2018-12-07: 1 mL via EPIDURAL

## 2019-02-28 DIAGNOSIS — Z6827 Body mass index (BMI) 27.0-27.9, adult: Secondary | ICD-10-CM | POA: Diagnosis not present

## 2019-02-28 DIAGNOSIS — E1121 Type 2 diabetes mellitus with diabetic nephropathy: Secondary | ICD-10-CM | POA: Diagnosis not present

## 2019-02-28 DIAGNOSIS — N183 Chronic kidney disease, stage 3 (moderate): Secondary | ICD-10-CM | POA: Diagnosis not present

## 2019-02-28 DIAGNOSIS — M545 Low back pain: Secondary | ICD-10-CM | POA: Diagnosis not present

## 2019-02-28 DIAGNOSIS — I10 Essential (primary) hypertension: Secondary | ICD-10-CM | POA: Diagnosis not present

## 2019-03-15 DIAGNOSIS — M1990 Unspecified osteoarthritis, unspecified site: Secondary | ICD-10-CM | POA: Diagnosis not present

## 2019-04-11 ENCOUNTER — Other Ambulatory Visit: Payer: Self-pay | Admitting: Internal Medicine

## 2019-04-11 DIAGNOSIS — G8929 Other chronic pain: Secondary | ICD-10-CM

## 2019-04-11 DIAGNOSIS — M545 Low back pain, unspecified: Secondary | ICD-10-CM

## 2019-04-20 ENCOUNTER — Other Ambulatory Visit: Payer: Medicare HMO

## 2019-04-30 DIAGNOSIS — M545 Low back pain: Secondary | ICD-10-CM | POA: Diagnosis not present

## 2019-04-30 DIAGNOSIS — I10 Essential (primary) hypertension: Secondary | ICD-10-CM | POA: Diagnosis not present

## 2019-04-30 DIAGNOSIS — Z6824 Body mass index (BMI) 24.0-24.9, adult: Secondary | ICD-10-CM | POA: Diagnosis not present

## 2019-04-30 DIAGNOSIS — E1121 Type 2 diabetes mellitus with diabetic nephropathy: Secondary | ICD-10-CM | POA: Diagnosis not present

## 2019-04-30 DIAGNOSIS — N183 Chronic kidney disease, stage 3 (moderate): Secondary | ICD-10-CM | POA: Diagnosis not present

## 2019-05-04 ENCOUNTER — Ambulatory Visit
Admission: RE | Admit: 2019-05-04 | Discharge: 2019-05-04 | Disposition: A | Discharge status: Home / Self Care | Payer: Medicare HMO | Source: Ambulatory Visit | Attending: Internal Medicine | Admitting: Internal Medicine

## 2019-05-04 DIAGNOSIS — M545 Low back pain, unspecified: Secondary | ICD-10-CM

## 2019-05-04 DIAGNOSIS — M5416 Radiculopathy, lumbar region: Secondary | ICD-10-CM | POA: Diagnosis not present

## 2019-05-04 DIAGNOSIS — G8929 Other chronic pain: Secondary | ICD-10-CM

## 2019-05-04 MED ORDER — METHYLPREDNISOLONE ACETATE 40 MG/ML INJ SUSP (RADIOLOG
120.0000 mg | Freq: Once | INTRAMUSCULAR | Status: AC
  Administered 2019-05-04: 120 mg via EPIDURAL

## 2019-05-04 MED ORDER — IOPAMIDOL (ISOVUE-M 200) INJECTION 41%
1.0000 mL | Freq: Once | INTRAMUSCULAR | Status: AC
  Administered 2019-05-04: 1 mL via EPIDURAL

## 2019-05-04 NOTE — Discharge Instructions (Signed)

## 2019-06-15 DIAGNOSIS — I2510 Atherosclerotic heart disease of native coronary artery without angina pectoris: Secondary | ICD-10-CM | POA: Diagnosis not present

## 2019-06-15 DIAGNOSIS — I10 Essential (primary) hypertension: Secondary | ICD-10-CM | POA: Diagnosis not present

## 2019-08-01 DIAGNOSIS — Z1389 Encounter for screening for other disorder: Secondary | ICD-10-CM | POA: Diagnosis not present

## 2019-08-01 DIAGNOSIS — E1121 Type 2 diabetes mellitus with diabetic nephropathy: Secondary | ICD-10-CM | POA: Diagnosis not present

## 2019-08-01 DIAGNOSIS — Z6826 Body mass index (BMI) 26.0-26.9, adult: Secondary | ICD-10-CM | POA: Diagnosis not present

## 2019-08-01 DIAGNOSIS — Z0000 Encounter for general adult medical examination without abnormal findings: Secondary | ICD-10-CM | POA: Diagnosis not present

## 2019-08-01 DIAGNOSIS — I10 Essential (primary) hypertension: Secondary | ICD-10-CM | POA: Diagnosis not present

## 2019-08-01 DIAGNOSIS — R69 Illness, unspecified: Secondary | ICD-10-CM | POA: Diagnosis not present

## 2019-08-01 DIAGNOSIS — M545 Low back pain: Secondary | ICD-10-CM | POA: Diagnosis not present

## 2019-08-02 DIAGNOSIS — R69 Illness, unspecified: Secondary | ICD-10-CM | POA: Diagnosis not present

## 2019-08-04 DIAGNOSIS — R69 Illness, unspecified: Secondary | ICD-10-CM | POA: Diagnosis not present

## 2019-08-06 DIAGNOSIS — E860 Dehydration: Secondary | ICD-10-CM | POA: Diagnosis not present

## 2019-08-06 DIAGNOSIS — Z20828 Contact with and (suspected) exposure to other viral communicable diseases: Secondary | ICD-10-CM | POA: Diagnosis not present

## 2019-08-06 DIAGNOSIS — R296 Repeated falls: Secondary | ICD-10-CM | POA: Diagnosis not present

## 2019-08-06 DIAGNOSIS — M810 Age-related osteoporosis without current pathological fracture: Secondary | ICD-10-CM | POA: Diagnosis not present

## 2019-08-06 DIAGNOSIS — N179 Acute kidney failure, unspecified: Secondary | ICD-10-CM | POA: Diagnosis not present

## 2019-08-06 DIAGNOSIS — I480 Paroxysmal atrial fibrillation: Secondary | ICD-10-CM | POA: Diagnosis not present

## 2019-08-06 DIAGNOSIS — Z1159 Encounter for screening for other viral diseases: Secondary | ICD-10-CM | POA: Diagnosis not present

## 2019-08-06 DIAGNOSIS — I10 Essential (primary) hypertension: Secondary | ICD-10-CM | POA: Diagnosis not present

## 2019-08-06 DIAGNOSIS — R2689 Other abnormalities of gait and mobility: Secondary | ICD-10-CM | POA: Diagnosis not present

## 2019-08-06 DIAGNOSIS — E785 Hyperlipidemia, unspecified: Secondary | ICD-10-CM | POA: Diagnosis not present

## 2019-08-06 DIAGNOSIS — R4182 Altered mental status, unspecified: Secondary | ICD-10-CM | POA: Diagnosis not present

## 2019-08-06 DIAGNOSIS — R1312 Dysphagia, oropharyngeal phase: Secondary | ICD-10-CM | POA: Diagnosis not present

## 2019-08-06 DIAGNOSIS — X58XXXA Exposure to other specified factors, initial encounter: Secondary | ICD-10-CM | POA: Diagnosis not present

## 2019-08-06 DIAGNOSIS — J9811 Atelectasis: Secondary | ICD-10-CM | POA: Diagnosis not present

## 2019-08-06 DIAGNOSIS — M419 Scoliosis, unspecified: Secondary | ICD-10-CM | POA: Diagnosis not present

## 2019-08-06 DIAGNOSIS — R0989 Other specified symptoms and signs involving the circulatory and respiratory systems: Secondary | ICD-10-CM | POA: Diagnosis not present

## 2019-08-06 DIAGNOSIS — J984 Other disorders of lung: Secondary | ICD-10-CM | POA: Diagnosis not present

## 2019-08-06 DIAGNOSIS — R41841 Cognitive communication deficit: Secondary | ICD-10-CM | POA: Diagnosis not present

## 2019-08-06 DIAGNOSIS — R05 Cough: Secondary | ICD-10-CM | POA: Diagnosis not present

## 2019-08-06 DIAGNOSIS — R69 Illness, unspecified: Secondary | ICD-10-CM | POA: Diagnosis not present

## 2019-08-06 DIAGNOSIS — T17920D Food in respiratory tract, part unspecified causing asphyxiation, subsequent encounter: Secondary | ICD-10-CM | POA: Diagnosis not present

## 2019-08-06 DIAGNOSIS — M6281 Muscle weakness (generalized): Secondary | ICD-10-CM | POA: Diagnosis not present

## 2019-08-17 DIAGNOSIS — R69 Illness, unspecified: Secondary | ICD-10-CM | POA: Diagnosis not present

## 2019-08-17 DIAGNOSIS — I10 Essential (primary) hypertension: Secondary | ICD-10-CM | POA: Diagnosis not present

## 2019-08-17 DIAGNOSIS — M6281 Muscle weakness (generalized): Secondary | ICD-10-CM | POA: Diagnosis not present

## 2019-08-17 DIAGNOSIS — N179 Acute kidney failure, unspecified: Secondary | ICD-10-CM | POA: Diagnosis not present

## 2019-08-17 DIAGNOSIS — M419 Scoliosis, unspecified: Secondary | ICD-10-CM | POA: Diagnosis not present

## 2019-08-17 DIAGNOSIS — R41841 Cognitive communication deficit: Secondary | ICD-10-CM | POA: Diagnosis not present

## 2019-08-17 DIAGNOSIS — R4182 Altered mental status, unspecified: Secondary | ICD-10-CM | POA: Diagnosis not present

## 2019-08-17 DIAGNOSIS — N19 Unspecified kidney failure: Secondary | ICD-10-CM | POA: Diagnosis not present

## 2019-08-17 DIAGNOSIS — R1319 Other dysphagia: Secondary | ICD-10-CM | POA: Diagnosis not present

## 2019-08-17 DIAGNOSIS — R2689 Other abnormalities of gait and mobility: Secondary | ICD-10-CM | POA: Diagnosis not present

## 2019-08-17 DIAGNOSIS — T17920D Food in respiratory tract, part unspecified causing asphyxiation, subsequent encounter: Secondary | ICD-10-CM | POA: Diagnosis not present

## 2019-08-17 DIAGNOSIS — E860 Dehydration: Secondary | ICD-10-CM | POA: Diagnosis not present

## 2019-08-17 DIAGNOSIS — R1312 Dysphagia, oropharyngeal phase: Secondary | ICD-10-CM | POA: Diagnosis not present

## 2019-08-17 DIAGNOSIS — R296 Repeated falls: Secondary | ICD-10-CM | POA: Diagnosis not present

## 2019-08-17 DIAGNOSIS — I480 Paroxysmal atrial fibrillation: Secondary | ICD-10-CM | POA: Diagnosis not present

## 2019-08-19 DIAGNOSIS — R4182 Altered mental status, unspecified: Secondary | ICD-10-CM | POA: Diagnosis not present

## 2019-08-19 DIAGNOSIS — R1319 Other dysphagia: Secondary | ICD-10-CM | POA: Diagnosis not present

## 2019-08-19 DIAGNOSIS — E860 Dehydration: Secondary | ICD-10-CM | POA: Diagnosis not present

## 2019-08-19 DIAGNOSIS — N19 Unspecified kidney failure: Secondary | ICD-10-CM | POA: Diagnosis not present

## 2019-08-31 DIAGNOSIS — Z79891 Long term (current) use of opiate analgesic: Secondary | ICD-10-CM | POA: Diagnosis not present

## 2019-08-31 DIAGNOSIS — I4891 Unspecified atrial fibrillation: Secondary | ICD-10-CM | POA: Diagnosis not present

## 2019-08-31 DIAGNOSIS — N179 Acute kidney failure, unspecified: Secondary | ICD-10-CM | POA: Diagnosis not present

## 2019-08-31 DIAGNOSIS — R69 Illness, unspecified: Secondary | ICD-10-CM | POA: Diagnosis not present

## 2019-08-31 DIAGNOSIS — Z9181 History of falling: Secondary | ICD-10-CM | POA: Diagnosis not present

## 2019-08-31 DIAGNOSIS — I10 Essential (primary) hypertension: Secondary | ICD-10-CM | POA: Diagnosis not present

## 2019-08-31 DIAGNOSIS — R2689 Other abnormalities of gait and mobility: Secondary | ICD-10-CM | POA: Diagnosis not present

## 2019-09-03 DIAGNOSIS — Z79891 Long term (current) use of opiate analgesic: Secondary | ICD-10-CM | POA: Diagnosis not present

## 2019-09-03 DIAGNOSIS — I10 Essential (primary) hypertension: Secondary | ICD-10-CM | POA: Diagnosis not present

## 2019-09-03 DIAGNOSIS — N179 Acute kidney failure, unspecified: Secondary | ICD-10-CM | POA: Diagnosis not present

## 2019-09-03 DIAGNOSIS — R69 Illness, unspecified: Secondary | ICD-10-CM | POA: Diagnosis not present

## 2019-09-03 DIAGNOSIS — I4891 Unspecified atrial fibrillation: Secondary | ICD-10-CM | POA: Diagnosis not present

## 2019-09-03 DIAGNOSIS — Z9181 History of falling: Secondary | ICD-10-CM | POA: Diagnosis not present

## 2019-09-03 DIAGNOSIS — R2689 Other abnormalities of gait and mobility: Secondary | ICD-10-CM | POA: Diagnosis not present

## 2019-09-04 DIAGNOSIS — R69 Illness, unspecified: Secondary | ICD-10-CM | POA: Diagnosis not present

## 2019-09-04 DIAGNOSIS — I4891 Unspecified atrial fibrillation: Secondary | ICD-10-CM | POA: Diagnosis not present

## 2019-09-04 DIAGNOSIS — N179 Acute kidney failure, unspecified: Secondary | ICD-10-CM | POA: Diagnosis not present

## 2019-09-04 DIAGNOSIS — Z9181 History of falling: Secondary | ICD-10-CM | POA: Diagnosis not present

## 2019-09-04 DIAGNOSIS — Z79891 Long term (current) use of opiate analgesic: Secondary | ICD-10-CM | POA: Diagnosis not present

## 2019-09-04 DIAGNOSIS — R2689 Other abnormalities of gait and mobility: Secondary | ICD-10-CM | POA: Diagnosis not present

## 2019-09-04 DIAGNOSIS — I10 Essential (primary) hypertension: Secondary | ICD-10-CM | POA: Diagnosis not present

## 2019-09-05 DIAGNOSIS — I10 Essential (primary) hypertension: Secondary | ICD-10-CM | POA: Diagnosis not present

## 2019-09-05 DIAGNOSIS — Z9181 History of falling: Secondary | ICD-10-CM | POA: Diagnosis not present

## 2019-09-05 DIAGNOSIS — I4891 Unspecified atrial fibrillation: Secondary | ICD-10-CM | POA: Diagnosis not present

## 2019-09-05 DIAGNOSIS — Z79891 Long term (current) use of opiate analgesic: Secondary | ICD-10-CM | POA: Diagnosis not present

## 2019-09-05 DIAGNOSIS — N179 Acute kidney failure, unspecified: Secondary | ICD-10-CM | POA: Diagnosis not present

## 2019-09-05 DIAGNOSIS — R69 Illness, unspecified: Secondary | ICD-10-CM | POA: Diagnosis not present

## 2019-09-05 DIAGNOSIS — R2689 Other abnormalities of gait and mobility: Secondary | ICD-10-CM | POA: Diagnosis not present

## 2019-09-06 DIAGNOSIS — R2689 Other abnormalities of gait and mobility: Secondary | ICD-10-CM | POA: Diagnosis not present

## 2019-09-06 DIAGNOSIS — R69 Illness, unspecified: Secondary | ICD-10-CM | POA: Diagnosis not present

## 2019-09-06 DIAGNOSIS — Z79891 Long term (current) use of opiate analgesic: Secondary | ICD-10-CM | POA: Diagnosis not present

## 2019-09-06 DIAGNOSIS — I4891 Unspecified atrial fibrillation: Secondary | ICD-10-CM | POA: Diagnosis not present

## 2019-09-06 DIAGNOSIS — Z9181 History of falling: Secondary | ICD-10-CM | POA: Diagnosis not present

## 2019-09-06 DIAGNOSIS — I10 Essential (primary) hypertension: Secondary | ICD-10-CM | POA: Diagnosis not present

## 2019-09-06 DIAGNOSIS — N179 Acute kidney failure, unspecified: Secondary | ICD-10-CM | POA: Diagnosis not present

## 2019-09-10 DIAGNOSIS — N179 Acute kidney failure, unspecified: Secondary | ICD-10-CM | POA: Diagnosis not present

## 2019-09-10 DIAGNOSIS — Z79891 Long term (current) use of opiate analgesic: Secondary | ICD-10-CM | POA: Diagnosis not present

## 2019-09-10 DIAGNOSIS — R69 Illness, unspecified: Secondary | ICD-10-CM | POA: Diagnosis not present

## 2019-09-10 DIAGNOSIS — I4891 Unspecified atrial fibrillation: Secondary | ICD-10-CM | POA: Diagnosis not present

## 2019-09-10 DIAGNOSIS — I10 Essential (primary) hypertension: Secondary | ICD-10-CM | POA: Diagnosis not present

## 2019-09-10 DIAGNOSIS — R2689 Other abnormalities of gait and mobility: Secondary | ICD-10-CM | POA: Diagnosis not present

## 2019-09-10 DIAGNOSIS — Z9181 History of falling: Secondary | ICD-10-CM | POA: Diagnosis not present

## 2019-09-12 DIAGNOSIS — R69 Illness, unspecified: Secondary | ICD-10-CM | POA: Diagnosis not present

## 2019-09-12 DIAGNOSIS — R2689 Other abnormalities of gait and mobility: Secondary | ICD-10-CM | POA: Diagnosis not present

## 2019-09-12 DIAGNOSIS — Z79891 Long term (current) use of opiate analgesic: Secondary | ICD-10-CM | POA: Diagnosis not present

## 2019-09-12 DIAGNOSIS — I4891 Unspecified atrial fibrillation: Secondary | ICD-10-CM | POA: Diagnosis not present

## 2019-09-12 DIAGNOSIS — N179 Acute kidney failure, unspecified: Secondary | ICD-10-CM | POA: Diagnosis not present

## 2019-09-12 DIAGNOSIS — Z9181 History of falling: Secondary | ICD-10-CM | POA: Diagnosis not present

## 2019-09-12 DIAGNOSIS — I10 Essential (primary) hypertension: Secondary | ICD-10-CM | POA: Diagnosis not present

## 2019-09-13 DIAGNOSIS — I10 Essential (primary) hypertension: Secondary | ICD-10-CM | POA: Diagnosis not present

## 2019-09-13 DIAGNOSIS — N1831 Chronic kidney disease, stage 3a: Secondary | ICD-10-CM | POA: Diagnosis not present

## 2019-09-13 DIAGNOSIS — E1121 Type 2 diabetes mellitus with diabetic nephropathy: Secondary | ICD-10-CM | POA: Diagnosis not present

## 2019-09-13 DIAGNOSIS — R69 Illness, unspecified: Secondary | ICD-10-CM | POA: Diagnosis not present

## 2019-09-13 DIAGNOSIS — Z6825 Body mass index (BMI) 25.0-25.9, adult: Secondary | ICD-10-CM | POA: Diagnosis not present

## 2019-09-13 DIAGNOSIS — M545 Low back pain: Secondary | ICD-10-CM | POA: Diagnosis not present

## 2019-09-14 ENCOUNTER — Other Ambulatory Visit: Payer: Self-pay | Admitting: Internal Medicine

## 2019-09-14 DIAGNOSIS — G8929 Other chronic pain: Secondary | ICD-10-CM

## 2019-09-14 DIAGNOSIS — M545 Low back pain, unspecified: Secondary | ICD-10-CM

## 2019-09-20 DIAGNOSIS — Z79891 Long term (current) use of opiate analgesic: Secondary | ICD-10-CM | POA: Diagnosis not present

## 2019-09-20 DIAGNOSIS — R69 Illness, unspecified: Secondary | ICD-10-CM | POA: Diagnosis not present

## 2019-09-20 DIAGNOSIS — R2689 Other abnormalities of gait and mobility: Secondary | ICD-10-CM | POA: Diagnosis not present

## 2019-09-20 DIAGNOSIS — N179 Acute kidney failure, unspecified: Secondary | ICD-10-CM | POA: Diagnosis not present

## 2019-09-20 DIAGNOSIS — I4891 Unspecified atrial fibrillation: Secondary | ICD-10-CM | POA: Diagnosis not present

## 2019-09-20 DIAGNOSIS — Z9181 History of falling: Secondary | ICD-10-CM | POA: Diagnosis not present

## 2019-09-20 DIAGNOSIS — I10 Essential (primary) hypertension: Secondary | ICD-10-CM | POA: Diagnosis not present

## 2019-09-24 ENCOUNTER — Other Ambulatory Visit: Payer: Self-pay

## 2019-09-24 ENCOUNTER — Ambulatory Visit
Admission: RE | Admit: 2019-09-24 | Discharge: 2019-09-24 | Disposition: A | Discharge status: Home / Self Care | Payer: Medicare HMO | Source: Ambulatory Visit | Attending: Internal Medicine | Admitting: Internal Medicine

## 2019-09-24 DIAGNOSIS — G8929 Other chronic pain: Secondary | ICD-10-CM | POA: Diagnosis not present

## 2019-09-24 DIAGNOSIS — M545 Low back pain: Secondary | ICD-10-CM | POA: Diagnosis not present

## 2019-09-24 MED ORDER — METHYLPREDNISOLONE ACETATE 40 MG/ML INJ SUSP (RADIOLOG
120.0000 mg | Freq: Once | INTRAMUSCULAR | Status: AC
  Administered 2019-09-24: 120 mg via EPIDURAL

## 2019-09-24 MED ORDER — IOPAMIDOL (ISOVUE-M 200) INJECTION 41%
1.0000 mL | Freq: Once | INTRAMUSCULAR | Status: AC
  Administered 2019-09-24: 1 mL via EPIDURAL

## 2019-11-16 DIAGNOSIS — Z20822 Contact with and (suspected) exposure to covid-19: Secondary | ICD-10-CM | POA: Diagnosis not present

## 2019-11-16 DIAGNOSIS — I10 Essential (primary) hypertension: Secondary | ICD-10-CM | POA: Diagnosis not present

## 2019-11-16 DIAGNOSIS — M419 Scoliosis, unspecified: Secondary | ICD-10-CM | POA: Diagnosis not present

## 2019-11-16 DIAGNOSIS — E785 Hyperlipidemia, unspecified: Secondary | ICD-10-CM | POA: Diagnosis not present

## 2019-11-16 DIAGNOSIS — M810 Age-related osteoporosis without current pathological fracture: Secondary | ICD-10-CM | POA: Diagnosis not present

## 2019-11-16 DIAGNOSIS — R4182 Altered mental status, unspecified: Secondary | ICD-10-CM | POA: Diagnosis not present

## 2019-11-16 DIAGNOSIS — N400 Enlarged prostate without lower urinary tract symptoms: Secondary | ICD-10-CM | POA: Diagnosis not present

## 2019-11-16 DIAGNOSIS — R69 Illness, unspecified: Secondary | ICD-10-CM | POA: Diagnosis not present

## 2019-11-16 DIAGNOSIS — I4891 Unspecified atrial fibrillation: Secondary | ICD-10-CM | POA: Diagnosis not present

## 2019-11-16 DIAGNOSIS — R110 Nausea: Secondary | ICD-10-CM | POA: Diagnosis not present

## 2019-11-16 DIAGNOSIS — M791 Myalgia, unspecified site: Secondary | ICD-10-CM | POA: Diagnosis not present

## 2019-11-16 DIAGNOSIS — M25551 Pain in right hip: Secondary | ICD-10-CM | POA: Diagnosis not present

## 2019-11-17 DIAGNOSIS — R4182 Altered mental status, unspecified: Secondary | ICD-10-CM | POA: Diagnosis not present

## 2019-11-17 DIAGNOSIS — I10 Essential (primary) hypertension: Secondary | ICD-10-CM | POA: Diagnosis not present

## 2019-11-17 DIAGNOSIS — M419 Scoliosis, unspecified: Secondary | ICD-10-CM | POA: Diagnosis not present

## 2019-11-17 DIAGNOSIS — M791 Myalgia, unspecified site: Secondary | ICD-10-CM | POA: Diagnosis not present

## 2019-11-17 DIAGNOSIS — J9811 Atelectasis: Secondary | ICD-10-CM | POA: Diagnosis not present

## 2019-11-18 DIAGNOSIS — M791 Myalgia, unspecified site: Secondary | ICD-10-CM | POA: Diagnosis not present

## 2019-11-18 DIAGNOSIS — I10 Essential (primary) hypertension: Secondary | ICD-10-CM | POA: Diagnosis not present

## 2019-11-18 DIAGNOSIS — R4182 Altered mental status, unspecified: Secondary | ICD-10-CM | POA: Diagnosis not present

## 2019-11-18 DIAGNOSIS — M419 Scoliosis, unspecified: Secondary | ICD-10-CM | POA: Diagnosis not present

## 2019-11-19 DIAGNOSIS — M419 Scoliosis, unspecified: Secondary | ICD-10-CM | POA: Diagnosis not present

## 2019-11-19 DIAGNOSIS — R4182 Altered mental status, unspecified: Secondary | ICD-10-CM | POA: Diagnosis not present

## 2019-11-19 DIAGNOSIS — M791 Myalgia, unspecified site: Secondary | ICD-10-CM | POA: Diagnosis not present

## 2019-11-19 DIAGNOSIS — I10 Essential (primary) hypertension: Secondary | ICD-10-CM | POA: Diagnosis not present

## 2019-12-24 DIAGNOSIS — Z6825 Body mass index (BMI) 25.0-25.9, adult: Secondary | ICD-10-CM | POA: Diagnosis not present

## 2019-12-24 DIAGNOSIS — N183 Chronic kidney disease, stage 3 unspecified: Secondary | ICD-10-CM | POA: Diagnosis not present

## 2019-12-24 DIAGNOSIS — E1121 Type 2 diabetes mellitus with diabetic nephropathy: Secondary | ICD-10-CM | POA: Diagnosis not present

## 2019-12-24 DIAGNOSIS — I10 Essential (primary) hypertension: Secondary | ICD-10-CM | POA: Diagnosis not present

## 2019-12-24 DIAGNOSIS — R69 Illness, unspecified: Secondary | ICD-10-CM | POA: Diagnosis not present

## 2019-12-24 DIAGNOSIS — M545 Low back pain: Secondary | ICD-10-CM | POA: Diagnosis not present

## 2019-12-25 DIAGNOSIS — N183 Chronic kidney disease, stage 3 unspecified: Secondary | ICD-10-CM | POA: Diagnosis not present

## 2019-12-25 DIAGNOSIS — M545 Low back pain: Secondary | ICD-10-CM | POA: Diagnosis not present

## 2019-12-25 DIAGNOSIS — R69 Illness, unspecified: Secondary | ICD-10-CM | POA: Diagnosis not present

## 2019-12-25 DIAGNOSIS — I10 Essential (primary) hypertension: Secondary | ICD-10-CM | POA: Diagnosis not present

## 2019-12-25 DIAGNOSIS — E1121 Type 2 diabetes mellitus with diabetic nephropathy: Secondary | ICD-10-CM | POA: Diagnosis not present

## 2020-02-06 DIAGNOSIS — R69 Illness, unspecified: Secondary | ICD-10-CM | POA: Diagnosis not present

## 2020-02-06 DIAGNOSIS — E785 Hyperlipidemia, unspecified: Secondary | ICD-10-CM | POA: Diagnosis not present

## 2020-02-06 DIAGNOSIS — I252 Old myocardial infarction: Secondary | ICD-10-CM | POA: Diagnosis not present

## 2020-02-06 DIAGNOSIS — I69319 Unspecified symptoms and signs involving cognitive functions following cerebral infarction: Secondary | ICD-10-CM | POA: Diagnosis not present

## 2020-02-06 DIAGNOSIS — I10 Essential (primary) hypertension: Secondary | ICD-10-CM | POA: Diagnosis not present

## 2020-02-06 DIAGNOSIS — Z008 Encounter for other general examination: Secondary | ICD-10-CM | POA: Diagnosis not present

## 2020-02-06 DIAGNOSIS — K5900 Constipation, unspecified: Secondary | ICD-10-CM | POA: Diagnosis not present

## 2020-02-06 DIAGNOSIS — I69328 Other speech and language deficits following cerebral infarction: Secondary | ICD-10-CM | POA: Diagnosis not present

## 2020-02-06 DIAGNOSIS — I69351 Hemiplegia and hemiparesis following cerebral infarction affecting right dominant side: Secondary | ICD-10-CM | POA: Diagnosis not present

## 2020-02-06 DIAGNOSIS — I739 Peripheral vascular disease, unspecified: Secondary | ICD-10-CM | POA: Diagnosis not present

## 2020-02-06 DIAGNOSIS — G8929 Other chronic pain: Secondary | ICD-10-CM | POA: Diagnosis not present

## 2020-03-06 ENCOUNTER — Other Ambulatory Visit: Payer: Self-pay | Admitting: Internal Medicine

## 2020-03-06 DIAGNOSIS — M545 Low back pain, unspecified: Secondary | ICD-10-CM

## 2020-03-06 DIAGNOSIS — G8929 Other chronic pain: Secondary | ICD-10-CM

## 2020-03-13 ENCOUNTER — Other Ambulatory Visit: Payer: Medicare HMO

## 2020-03-13 ENCOUNTER — Ambulatory Visit
Admission: RE | Admit: 2020-03-13 | Discharge: 2020-03-13 | Disposition: A | Discharge status: Home / Self Care | Payer: Medicare HMO | Source: Ambulatory Visit | Attending: Internal Medicine | Admitting: Internal Medicine

## 2020-03-13 DIAGNOSIS — M545 Low back pain, unspecified: Secondary | ICD-10-CM

## 2020-03-13 DIAGNOSIS — G8929 Other chronic pain: Secondary | ICD-10-CM

## 2020-03-13 MED ORDER — METHYLPREDNISOLONE ACETATE 40 MG/ML INJ SUSP (RADIOLOG
120.0000 mg | Freq: Once | INTRAMUSCULAR | Status: AC
  Administered 2020-03-13: 120 mg via EPIDURAL

## 2020-03-13 MED ORDER — IOPAMIDOL (ISOVUE-M 200) INJECTION 41%
1.0000 mL | Freq: Once | INTRAMUSCULAR | Status: AC
  Administered 2020-03-13: 1 mL via EPIDURAL

## 2020-03-13 NOTE — Discharge Instructions (Signed)

## 2020-03-25 DIAGNOSIS — R69 Illness, unspecified: Secondary | ICD-10-CM | POA: Diagnosis not present

## 2020-03-25 DIAGNOSIS — N183 Chronic kidney disease, stage 3 unspecified: Secondary | ICD-10-CM | POA: Diagnosis not present

## 2020-03-25 DIAGNOSIS — M545 Low back pain: Secondary | ICD-10-CM | POA: Diagnosis not present

## 2020-03-25 DIAGNOSIS — I10 Essential (primary) hypertension: Secondary | ICD-10-CM | POA: Diagnosis not present

## 2020-03-25 DIAGNOSIS — E1121 Type 2 diabetes mellitus with diabetic nephropathy: Secondary | ICD-10-CM | POA: Diagnosis not present

## 2020-03-25 DIAGNOSIS — Z6825 Body mass index (BMI) 25.0-25.9, adult: Secondary | ICD-10-CM | POA: Diagnosis not present

## 2020-04-15 DIAGNOSIS — R251 Tremor, unspecified: Secondary | ICD-10-CM | POA: Diagnosis not present

## 2020-04-15 DIAGNOSIS — I10 Essential (primary) hypertension: Secondary | ICD-10-CM | POA: Diagnosis not present

## 2020-04-15 DIAGNOSIS — R5381 Other malaise: Secondary | ICD-10-CM | POA: Diagnosis not present

## 2020-04-17 DIAGNOSIS — Z6824 Body mass index (BMI) 24.0-24.9, adult: Secondary | ICD-10-CM | POA: Diagnosis not present

## 2020-04-17 DIAGNOSIS — G250 Essential tremor: Secondary | ICD-10-CM | POA: Diagnosis not present

## 2020-06-25 DIAGNOSIS — G250 Essential tremor: Secondary | ICD-10-CM | POA: Diagnosis not present

## 2020-06-25 DIAGNOSIS — Z6825 Body mass index (BMI) 25.0-25.9, adult: Secondary | ICD-10-CM | POA: Diagnosis not present

## 2020-06-25 DIAGNOSIS — N183 Chronic kidney disease, stage 3 unspecified: Secondary | ICD-10-CM | POA: Diagnosis not present

## 2020-06-25 DIAGNOSIS — M545 Low back pain: Secondary | ICD-10-CM | POA: Diagnosis not present

## 2020-06-25 DIAGNOSIS — E1121 Type 2 diabetes mellitus with diabetic nephropathy: Secondary | ICD-10-CM | POA: Diagnosis not present

## 2020-06-25 DIAGNOSIS — R69 Illness, unspecified: Secondary | ICD-10-CM | POA: Diagnosis not present

## 2020-06-25 DIAGNOSIS — I10 Essential (primary) hypertension: Secondary | ICD-10-CM | POA: Diagnosis not present

## 2020-06-26 DIAGNOSIS — H524 Presbyopia: Secondary | ICD-10-CM | POA: Diagnosis not present

## 2020-06-26 DIAGNOSIS — H40023 Open angle with borderline findings, high risk, bilateral: Secondary | ICD-10-CM | POA: Diagnosis not present

## 2020-07-08 ENCOUNTER — Other Ambulatory Visit: Payer: Self-pay | Admitting: Internal Medicine

## 2020-07-08 DIAGNOSIS — M47817 Spondylosis without myelopathy or radiculopathy, lumbosacral region: Secondary | ICD-10-CM

## 2020-07-11 DIAGNOSIS — I951 Orthostatic hypotension: Secondary | ICD-10-CM | POA: Diagnosis not present

## 2020-07-11 DIAGNOSIS — J3489 Other specified disorders of nose and nasal sinuses: Secondary | ICD-10-CM | POA: Diagnosis not present

## 2020-07-11 DIAGNOSIS — R55 Syncope and collapse: Secondary | ICD-10-CM | POA: Diagnosis not present

## 2020-07-11 DIAGNOSIS — Z681 Body mass index (BMI) 19 or less, adult: Secondary | ICD-10-CM | POA: Diagnosis not present

## 2020-07-11 DIAGNOSIS — W1839XA Other fall on same level, initial encounter: Secondary | ICD-10-CM | POA: Diagnosis not present

## 2020-07-11 DIAGNOSIS — R69 Illness, unspecified: Secondary | ICD-10-CM | POA: Diagnosis not present

## 2020-07-11 DIAGNOSIS — R4182 Altered mental status, unspecified: Secondary | ICD-10-CM | POA: Diagnosis not present

## 2020-07-11 DIAGNOSIS — D649 Anemia, unspecified: Secondary | ICD-10-CM | POA: Diagnosis not present

## 2020-07-11 DIAGNOSIS — R41841 Cognitive communication deficit: Secondary | ICD-10-CM | POA: Diagnosis not present

## 2020-07-11 DIAGNOSIS — S199XXA Unspecified injury of neck, initial encounter: Secondary | ICD-10-CM | POA: Diagnosis not present

## 2020-07-11 DIAGNOSIS — E440 Moderate protein-calorie malnutrition: Secondary | ICD-10-CM | POA: Diagnosis not present

## 2020-07-11 DIAGNOSIS — N179 Acute kidney failure, unspecified: Secondary | ICD-10-CM | POA: Diagnosis not present

## 2020-07-11 DIAGNOSIS — S0083XA Contusion of other part of head, initial encounter: Secondary | ICD-10-CM | POA: Diagnosis not present

## 2020-07-11 DIAGNOSIS — R001 Bradycardia, unspecified: Secondary | ICD-10-CM | POA: Diagnosis not present

## 2020-07-11 DIAGNOSIS — R410 Disorientation, unspecified: Secondary | ICD-10-CM | POA: Diagnosis not present

## 2020-07-11 DIAGNOSIS — B348 Other viral infections of unspecified site: Secondary | ICD-10-CM | POA: Diagnosis not present

## 2020-07-11 DIAGNOSIS — R509 Fever, unspecified: Secondary | ICD-10-CM | POA: Diagnosis not present

## 2020-07-11 DIAGNOSIS — W19XXXA Unspecified fall, initial encounter: Secondary | ICD-10-CM | POA: Diagnosis not present

## 2020-07-11 DIAGNOSIS — S0990XA Unspecified injury of head, initial encounter: Secondary | ICD-10-CM | POA: Diagnosis not present

## 2020-07-12 DIAGNOSIS — R55 Syncope and collapse: Secondary | ICD-10-CM | POA: Diagnosis not present

## 2020-07-12 DIAGNOSIS — W19XXXA Unspecified fall, initial encounter: Secondary | ICD-10-CM | POA: Diagnosis not present

## 2020-07-13 DIAGNOSIS — I10 Essential (primary) hypertension: Secondary | ICD-10-CM | POA: Diagnosis not present

## 2020-07-13 DIAGNOSIS — R55 Syncope and collapse: Secondary | ICD-10-CM | POA: Diagnosis not present

## 2020-07-13 DIAGNOSIS — R69 Illness, unspecified: Secondary | ICD-10-CM | POA: Diagnosis not present

## 2020-07-13 DIAGNOSIS — R2681 Unsteadiness on feet: Secondary | ICD-10-CM | POA: Diagnosis not present

## 2020-07-14 DIAGNOSIS — R001 Bradycardia, unspecified: Secondary | ICD-10-CM | POA: Diagnosis not present

## 2020-07-14 DIAGNOSIS — W19XXXA Unspecified fall, initial encounter: Secondary | ICD-10-CM | POA: Diagnosis not present

## 2020-07-14 DIAGNOSIS — I951 Orthostatic hypotension: Secondary | ICD-10-CM | POA: Diagnosis not present

## 2020-07-14 DIAGNOSIS — D649 Anemia, unspecified: Secondary | ICD-10-CM | POA: Diagnosis not present

## 2020-07-14 DIAGNOSIS — R55 Syncope and collapse: Secondary | ICD-10-CM | POA: Diagnosis not present

## 2020-07-14 DIAGNOSIS — R2681 Unsteadiness on feet: Secondary | ICD-10-CM | POA: Diagnosis not present

## 2020-07-14 DIAGNOSIS — I2510 Atherosclerotic heart disease of native coronary artery without angina pectoris: Secondary | ICD-10-CM | POA: Diagnosis not present

## 2020-07-14 DIAGNOSIS — R69 Illness, unspecified: Secondary | ICD-10-CM | POA: Diagnosis not present

## 2020-07-14 DIAGNOSIS — B348 Other viral infections of unspecified site: Secondary | ICD-10-CM | POA: Diagnosis not present

## 2020-07-14 DIAGNOSIS — N179 Acute kidney failure, unspecified: Secondary | ICD-10-CM | POA: Diagnosis not present

## 2020-07-14 DIAGNOSIS — E440 Moderate protein-calorie malnutrition: Secondary | ICD-10-CM | POA: Diagnosis not present

## 2020-07-14 DIAGNOSIS — Z681 Body mass index (BMI) 19 or less, adult: Secondary | ICD-10-CM | POA: Diagnosis not present

## 2020-07-14 DIAGNOSIS — I10 Essential (primary) hypertension: Secondary | ICD-10-CM | POA: Diagnosis not present

## 2020-07-14 DIAGNOSIS — E860 Dehydration: Secondary | ICD-10-CM | POA: Diagnosis not present

## 2020-07-15 ENCOUNTER — Inpatient Hospital Stay: Admission: RE | Admit: 2020-07-15 | Payer: Medicare HMO | Source: Ambulatory Visit

## 2020-07-15 DIAGNOSIS — R55 Syncope and collapse: Secondary | ICD-10-CM | POA: Diagnosis not present

## 2020-07-19 DIAGNOSIS — I950 Idiopathic hypotension: Secondary | ICD-10-CM | POA: Diagnosis not present

## 2020-07-19 DIAGNOSIS — K5900 Constipation, unspecified: Secondary | ICD-10-CM | POA: Diagnosis not present

## 2020-07-19 DIAGNOSIS — M419 Scoliosis, unspecified: Secondary | ICD-10-CM | POA: Diagnosis not present

## 2020-07-19 DIAGNOSIS — Z87891 Personal history of nicotine dependence: Secondary | ICD-10-CM | POA: Diagnosis not present

## 2020-07-19 DIAGNOSIS — Z955 Presence of coronary angioplasty implant and graft: Secondary | ICD-10-CM | POA: Diagnosis not present

## 2020-07-19 DIAGNOSIS — I252 Old myocardial infarction: Secondary | ICD-10-CM | POA: Diagnosis not present

## 2020-07-19 DIAGNOSIS — R69 Illness, unspecified: Secondary | ICD-10-CM | POA: Diagnosis not present

## 2020-07-19 DIAGNOSIS — I10 Essential (primary) hypertension: Secondary | ICD-10-CM | POA: Diagnosis not present

## 2020-07-19 DIAGNOSIS — I2510 Atherosclerotic heart disease of native coronary artery without angina pectoris: Secondary | ICD-10-CM | POA: Diagnosis not present

## 2020-07-19 DIAGNOSIS — Z9181 History of falling: Secondary | ICD-10-CM | POA: Diagnosis not present

## 2020-07-23 DIAGNOSIS — I2510 Atherosclerotic heart disease of native coronary artery without angina pectoris: Secondary | ICD-10-CM | POA: Diagnosis not present

## 2020-07-23 DIAGNOSIS — K5900 Constipation, unspecified: Secondary | ICD-10-CM | POA: Diagnosis not present

## 2020-07-23 DIAGNOSIS — Z87891 Personal history of nicotine dependence: Secondary | ICD-10-CM | POA: Diagnosis not present

## 2020-07-23 DIAGNOSIS — R69 Illness, unspecified: Secondary | ICD-10-CM | POA: Diagnosis not present

## 2020-07-23 DIAGNOSIS — I950 Idiopathic hypotension: Secondary | ICD-10-CM | POA: Diagnosis not present

## 2020-07-23 DIAGNOSIS — Z955 Presence of coronary angioplasty implant and graft: Secondary | ICD-10-CM | POA: Diagnosis not present

## 2020-07-23 DIAGNOSIS — I10 Essential (primary) hypertension: Secondary | ICD-10-CM | POA: Diagnosis not present

## 2020-07-23 DIAGNOSIS — Z9181 History of falling: Secondary | ICD-10-CM | POA: Diagnosis not present

## 2020-07-23 DIAGNOSIS — M419 Scoliosis, unspecified: Secondary | ICD-10-CM | POA: Diagnosis not present

## 2020-07-23 DIAGNOSIS — I252 Old myocardial infarction: Secondary | ICD-10-CM | POA: Diagnosis not present

## 2020-07-26 DIAGNOSIS — M419 Scoliosis, unspecified: Secondary | ICD-10-CM | POA: Diagnosis not present

## 2020-07-26 DIAGNOSIS — I2510 Atherosclerotic heart disease of native coronary artery without angina pectoris: Secondary | ICD-10-CM | POA: Diagnosis not present

## 2020-07-26 DIAGNOSIS — I252 Old myocardial infarction: Secondary | ICD-10-CM | POA: Diagnosis not present

## 2020-07-26 DIAGNOSIS — R69 Illness, unspecified: Secondary | ICD-10-CM | POA: Diagnosis not present

## 2020-07-26 DIAGNOSIS — I950 Idiopathic hypotension: Secondary | ICD-10-CM | POA: Diagnosis not present

## 2020-07-26 DIAGNOSIS — I10 Essential (primary) hypertension: Secondary | ICD-10-CM | POA: Diagnosis not present

## 2020-07-26 DIAGNOSIS — Z955 Presence of coronary angioplasty implant and graft: Secondary | ICD-10-CM | POA: Diagnosis not present

## 2020-07-26 DIAGNOSIS — Z87891 Personal history of nicotine dependence: Secondary | ICD-10-CM | POA: Diagnosis not present

## 2020-07-26 DIAGNOSIS — K5900 Constipation, unspecified: Secondary | ICD-10-CM | POA: Diagnosis not present

## 2020-07-26 DIAGNOSIS — Z9181 History of falling: Secondary | ICD-10-CM | POA: Diagnosis not present

## 2020-07-29 ENCOUNTER — Ambulatory Visit
Admission: RE | Admit: 2020-07-29 | Discharge: 2020-07-29 | Disposition: A | Discharge status: Home / Self Care | Payer: Medicare HMO | Source: Ambulatory Visit | Attending: Internal Medicine | Admitting: Internal Medicine

## 2020-07-29 DIAGNOSIS — M47817 Spondylosis without myelopathy or radiculopathy, lumbosacral region: Secondary | ICD-10-CM | POA: Diagnosis not present

## 2020-07-29 DIAGNOSIS — R55 Syncope and collapse: Secondary | ICD-10-CM | POA: Diagnosis not present

## 2020-07-29 DIAGNOSIS — R630 Anorexia: Secondary | ICD-10-CM | POA: Diagnosis not present

## 2020-07-29 DIAGNOSIS — Z6826 Body mass index (BMI) 26.0-26.9, adult: Secondary | ICD-10-CM | POA: Diagnosis not present

## 2020-07-29 MED ORDER — IOPAMIDOL (ISOVUE-M 200) INJECTION 41%
1.0000 mL | Freq: Once | INTRAMUSCULAR | Status: AC
  Administered 2020-07-29: 1 mL via EPIDURAL

## 2020-07-29 MED ORDER — METHYLPREDNISOLONE ACETATE 40 MG/ML INJ SUSP (RADIOLOG
120.0000 mg | Freq: Once | INTRAMUSCULAR | Status: AC
  Administered 2020-07-29: 120 mg via EPIDURAL

## 2020-07-29 NOTE — Discharge Instructions (Signed)

## 2020-08-14 DIAGNOSIS — H52223 Regular astigmatism, bilateral: Secondary | ICD-10-CM | POA: Diagnosis not present

## 2020-08-14 DIAGNOSIS — Z9849 Cataract extraction status, unspecified eye: Secondary | ICD-10-CM | POA: Diagnosis not present

## 2020-08-14 DIAGNOSIS — H35373 Puckering of macula, bilateral: Secondary | ICD-10-CM | POA: Diagnosis not present

## 2020-08-14 DIAGNOSIS — H5201 Hypermetropia, right eye: Secondary | ICD-10-CM | POA: Diagnosis not present

## 2020-08-14 DIAGNOSIS — H524 Presbyopia: Secondary | ICD-10-CM | POA: Diagnosis not present

## 2020-08-14 DIAGNOSIS — H40013 Open angle with borderline findings, low risk, bilateral: Secondary | ICD-10-CM | POA: Diagnosis not present

## 2020-08-14 DIAGNOSIS — H18413 Arcus senilis, bilateral: Secondary | ICD-10-CM | POA: Diagnosis not present

## 2020-08-14 DIAGNOSIS — Z961 Presence of intraocular lens: Secondary | ICD-10-CM | POA: Diagnosis not present

## 2020-08-27 DIAGNOSIS — E785 Hyperlipidemia, unspecified: Secondary | ICD-10-CM | POA: Insufficient documentation

## 2020-08-27 NOTE — Progress Notes (Deleted)
Cardiology Office Note   Date:  08/27/2020   ID:  Joe Anderson, DOB 1928-09-03, MRN [id number]  PCP:  Neale Burly, MD  Cardiologist:   No primary care provider on file. Referring:  ***  No chief complaint on file.     History of Present Illness: Joe Anderson is a 86 y.o. male who presents for evaluation of syncope.  I reviewed extensive notes from a hospitalization in Oct at [facility].  He was found unresponsive by his caregiver.  CT showed no abnormalities.  An echo was poor quality.  He was bradycardic in the hospital.  He had AKI that resolved with fluids.  It was suggested that he follow with a cardiologist to consider whether he needed a pacemaker.  ***    The patient had syncope in the past with a loop recorder implanted in 2019.  He had nighttime bradycardia.  He had had a previous syncopal episode in 2015 while driving.  He has CAD with stenting of an RCA in 2016.  He was previously seen by [facility] but is now referred here.     No past medical history on file.  *** The histories are not reviewed yet. Please review them in the "History" navigator section and refresh this Talty.   Current Outpatient Medications  Medication Sig Dispense Refill  . alendronate (FOSAMAX) 70 MG tablet Take 70 mg by mouth once a week.    Marland Kitchen amLODipine (NORVASC) 10 MG tablet Take 10 mg by mouth daily.    Marland Kitchen atorvastatin (LIPITOR) 40 MG tablet Take 40 mg by mouth daily.    . benazepril (LOTENSIN) 10 MG tablet Take 10 mg by mouth daily.    . benazepril (LOTENSIN) 20 MG tablet Take by mouth.    . celecoxib (CELEBREX) 200 MG capsule     . donepezil (ARICEPT) 10 MG tablet Take 10 mg by mouth daily.    . finasteride (PROSCAR) 5 MG tablet Take 5 mg by mouth daily.    Marland Kitchen HYDROcodone-acetaminophen (NORCO) 7.5-325 MG tablet Take 1 tablet by mouth 4 (four) times daily as needed.    Marland Kitchen ibuprofen (ADVIL) 200 MG tablet Take by mouth.    . Multiple Vitamin (MULTIVITAMIN) capsule Take by mouth.     . nitroGLYCERIN (NITROSTAT) 0.4 MG SL tablet Place under the tongue.    . Omega-3 Fatty Acids (FISH OIL) 1000 MG CAPS Take by mouth.     No current facility-administered medications for this visit.    Allergies:   Patient has no known allergies.    Social History:  The patient     Family History:  The patient's ***family history is not on file.    ROS:  Please see the history of present illness.   Otherwise, review of systems are positive for {NONE DEFAULTED:18576::"none"}.   All other systems are reviewed and negative.    PHYSICAL EXAM: VS:  There were no vitals taken for this visit. , BMI There is no height or weight on file to calculate BMI. GENERAL:  Well appearing HEENT:  Pupils equal round and reactive, fundi not visualized, oral mucosa unremarkable NECK:  No jugular venous distention, waveform within normal limits, carotid upstroke brisk and symmetric, no bruits, no thyromegaly LYMPHATICS:  No cervical, inguinal adenopathy LUNGS:  Clear to auscultation bilaterally BACK:  No CVA tenderness CHEST:  Unremarkable HEART:  PMI not displaced or sustained,S1 and S2 within normal limits, no S3, no S4, no clicks, no rubs, *** murmurs  ABD:  Flat, positive bowel sounds normal in frequency in pitch, no bruits, no rebound, no guarding, no midline pulsatile mass, no hepatomegaly, no splenomegaly EXT:  2 plus pulses throughout, no edema, no cyanosis no clubbing SKIN:  No rashes no nodules NEURO:  Cranial nerves II through XII grossly intact, motor grossly intact throughout PSYCH:  Cognitively intact, oriented to person place and time    EKG:  EKG {ACTION; IS/IS ZOX:09604540} ordered today. The ekg ordered today demonstrates ***   Recent Labs: No results found for requested labs within last 8760 hours.    Lipid Panel No results found for: CHOL, TRIG, HDL, CHOLHDL, VLDL, LDLCALC, LDLDIRECT    Wt Readings from Last 3 Encounters:  No data found for Wt      Other studies  Reviewed: Additional studies/ records that were reviewed today include: ***. Review of the above records demonstrates:  Please see elsewhere in the note.  ***   ASSESSMENT AND PLAN:  CAD: ***  SYNCOPE:  ***   HTN:  ***   DYSLIPIDEMIA:  ***    Current medicines are reviewed at length with the patient today.  The patient {ACTIONS; HAS/DOES NOT HAVE:19233} concerns regarding medicines.  The following changes have been made:  {PLAN; NO CHANGE:13088:s}  Labs/ tests ordered today include: *** No orders of the defined types were placed in this encounter.    Disposition:   FU with ***    Signed, Minus Breeding, MD  08/27/2020 8:43 PM    [facility]

## 2020-08-28 ENCOUNTER — Encounter: Payer: Self-pay | Admitting: *Deleted

## 2020-08-29 ENCOUNTER — Ambulatory Visit: Payer: Medicare HMO | Admitting: Cardiology

## 2020-08-29 DIAGNOSIS — I2510 Atherosclerotic heart disease of native coronary artery without angina pectoris: Secondary | ICD-10-CM

## 2020-08-29 DIAGNOSIS — E785 Hyperlipidemia, unspecified: Secondary | ICD-10-CM

## 2020-08-29 DIAGNOSIS — I10 Essential (primary) hypertension: Secondary | ICD-10-CM

## 2020-08-29 DIAGNOSIS — R55 Syncope and collapse: Secondary | ICD-10-CM

## 2020-10-06 DIAGNOSIS — G8929 Other chronic pain: Secondary | ICD-10-CM | POA: Diagnosis not present

## 2020-10-06 DIAGNOSIS — I110 Hypertensive heart disease with heart failure: Secondary | ICD-10-CM | POA: Diagnosis not present

## 2020-10-06 DIAGNOSIS — K5900 Constipation, unspecified: Secondary | ICD-10-CM | POA: Diagnosis not present

## 2020-10-06 DIAGNOSIS — I2510 Atherosclerotic heart disease of native coronary artery without angina pectoris: Secondary | ICD-10-CM | POA: Diagnosis not present

## 2020-10-06 DIAGNOSIS — N400 Enlarged prostate without lower urinary tract symptoms: Secondary | ICD-10-CM | POA: Diagnosis not present

## 2020-10-06 DIAGNOSIS — Z008 Encounter for other general examination: Secondary | ICD-10-CM | POA: Diagnosis not present

## 2020-10-06 DIAGNOSIS — N529 Male erectile dysfunction, unspecified: Secondary | ICD-10-CM | POA: Diagnosis not present

## 2020-10-06 DIAGNOSIS — R69 Illness, unspecified: Secondary | ICD-10-CM | POA: Diagnosis not present

## 2020-10-06 DIAGNOSIS — I509 Heart failure, unspecified: Secondary | ICD-10-CM | POA: Diagnosis not present

## 2020-10-06 DIAGNOSIS — M1990 Unspecified osteoarthritis, unspecified site: Secondary | ICD-10-CM | POA: Diagnosis not present

## 2020-10-06 DIAGNOSIS — I739 Peripheral vascular disease, unspecified: Secondary | ICD-10-CM | POA: Diagnosis not present

## 2020-10-07 DIAGNOSIS — I10 Essential (primary) hypertension: Secondary | ICD-10-CM | POA: Diagnosis not present

## 2020-10-07 DIAGNOSIS — M545 Low back pain, unspecified: Secondary | ICD-10-CM | POA: Diagnosis not present

## 2020-10-07 DIAGNOSIS — Z6826 Body mass index (BMI) 26.0-26.9, adult: Secondary | ICD-10-CM | POA: Diagnosis not present

## 2020-10-07 DIAGNOSIS — R630 Anorexia: Secondary | ICD-10-CM | POA: Diagnosis not present

## 2020-10-07 DIAGNOSIS — R55 Syncope and collapse: Secondary | ICD-10-CM | POA: Diagnosis not present

## 2020-10-27 ENCOUNTER — Ambulatory Visit: Payer: Medicare HMO | Admitting: Cardiology

## 2020-10-28 ENCOUNTER — Encounter: Payer: Self-pay | Admitting: Cardiology

## 2020-10-28 ENCOUNTER — Ambulatory Visit (INDEPENDENT_AMBULATORY_CARE_PROVIDER_SITE_OTHER): Payer: Medicare HMO | Admitting: Cardiology

## 2020-10-28 VITALS — BP 128/70 | HR 80 | Ht 65.0 in | Wt 157.2 lb

## 2020-10-28 DIAGNOSIS — R55 Syncope and collapse: Secondary | ICD-10-CM

## 2020-10-28 NOTE — Patient Instructions (Signed)
Your physician recommends that you schedule a follow-up appointment in: 6 MONTHS WITH DR BRANCH  Your physician recommends that you continue on your current medications as directed. Please refer to the Current Medication list given to you today.  Thank you for choosing [facility]!!    

## 2020-10-28 NOTE — Progress Notes (Signed)
Clinical Summary Joe Anderson is a 87 y.o.male seen as a new consult, referred by Dr Sherrie Sport for the following medical problems.  1. Syncope -  06/2020 CT head no acute progress.  06/2020 echo was nondiagnostic, poor visualization even with contrast.  - significant AKI at the time, thought to be prerenal as resolved with IVFs (Cr 1.7-->0.96), thought syncope may have been due to hypovolemia.     Previous evaluation at [facility], seen by EP - had loop recorder, removed 07/2018 - notes mention some brady and sinus pauses at night  - mainly drinks caffeinated sodas, no real water intake   2. CAD - History of coronary artery disease with PCI RCA in 2016 - Echocardiogram 09/2018. Normal LV EF 55-60% with normal wall motion.  - no recent symptoms   Past Medical History:  Diagnosis Date  . AKI (acute kidney injury) ([place])   . CAD (coronary artery disease)    stent to RCA (2016)  . Dementia ([place])   . Hypertension      No Known Allergies   Current Outpatient Medications  Medication Sig Dispense Refill  . alendronate (FOSAMAX) 70 MG tablet Take 70 mg by mouth once a week.    Marland Kitchen amLODipine (NORVASC) 10 MG tablet Take 10 mg by mouth daily.    Marland Kitchen atorvastatin (LIPITOR) 40 MG tablet Take 40 mg by mouth daily.    . benazepril (LOTENSIN) 10 MG tablet Take 10 mg by mouth daily.    . benazepril (LOTENSIN) 20 MG tablet Take by mouth.    . celecoxib (CELEBREX) 200 MG capsule     . donepezil (ARICEPT) 10 MG tablet Take 10 mg by mouth daily.    . finasteride (PROSCAR) 5 MG tablet Take 5 mg by mouth daily.    Marland Kitchen HYDROcodone-acetaminophen (NORCO) 7.5-325 MG tablet Take 1 tablet by mouth 4 (four) times daily as needed.    Marland Kitchen ibuprofen (ADVIL) 200 MG tablet Take by mouth.    . Multiple Vitamin (MULTIVITAMIN) capsule Take by mouth.    . nitroGLYCERIN (NITROSTAT) 0.4 MG SL tablet Place under the tongue.    . Omega-3 Fatty Acids (FISH OIL) 1000 MG CAPS Take by mouth.     No current  facility-administered medications for this visit.     Past Surgical History:  Procedure Laterality Date  . PERCUTANEOUS CORONARY STENT INTERVENTION (PCI-S)  2016   [facility]     No Known Allergies    No family history on file.   Social History Joe Anderson reports that he has quit smoking. He does not have any smokeless tobacco history on file. Mr. Veron has no history on file for alcohol use.   Review of Systems CONSTITUTIONAL: No weight loss, fever, chills, weakness or fatigue.  HEENT: Eyes: No visual loss, blurred vision, double vision or yellow sclerae.No hearing loss, sneezing, congestion, runny nose or sore throat.  SKIN: No rash or itching.  CARDIOVASCULAR: per hpi RESPIRATORY: No shortness of breath, cough or sputum.  GASTROINTESTINAL: No anorexia, nausea, vomiting or diarrhea. No abdominal pain or blood.  GENITOURINARY: No burning on urination, no polyuria NEUROLOGICAL: No headache, dizziness, syncope, paralysis, ataxia, numbness or tingling in the extremities. No change in bowel or bladder control.  MUSCULOSKELETAL: No muscle, back pain, joint pain or stiffness.  LYMPHATICS: No enlarged nodes. No history of splenectomy.  PSYCHIATRIC: No history of depression or anxiety.  ENDOCRINOLOGIC: No reports of sweating, cold or heat intolerance. No polyuria or polydipsia.  Marland Kitchen  Physical Examination Today's Vitals   10/28/20 1512  BP: 128/70  Pulse: 80  SpO2: 91%  Weight: 157 lb 3.2 oz (71.3 kg)  Height: 5\' 5"  (1.651 m)   Body mass index is 26.16 kg/m.  Gen: resting comfortably, no acute distress HEENT: no scleral icterus, pupils equal round and reactive, no palptable cervical adenopathy,  CV: RRR, no mr/g, no jvd Resp: Clear to auscultation bilaterally GI: abdomen is soft, non-tender, non-distended, normal bowel sounds, no hepatosplenomegaly MSK: extremities are warm, no edema.  Skin: warm, no rash Neuro:  no focal deficits Psych: appropriate affect   Diagnostic  Studies  05/2015 [facility] cath Left coronary artery: Left main angiographically normal Left anterior descending is a large vessel that wraps the apex. There is a moderate-sized diagonal one vessel. 25% stenosis noted in the mid LAD. Left circumflex artery is a large vessel. There is a high obtuse marginal one/ramus artery that has mild irregularities. Obtuse marginal 2 is a large vessel that bifurcates distally. Mild irregularities noted in the circumflex system  Right coronary artery: Tortuous artery that is dominant and enlarged in size. The site of a vessel band there is a roughly 70% stenosis noted in the midportion of the vessel.  Aortic pressure 167/66 Left ventriculography was not performed as the patient had preserved ejection fraction by echocardiogram and nuclear stress test.  PERCUTANEOUS CORONARY INTERVENTION: From the wrist, an Ikari right guide catheter gave excellent support in the RCA. The 75% lesion was negotiated utilizing a pilot 50 wire. I predilated with a 2.5 x 12 mm trek balloon. Because of the tortuosity a Synergy stent was placed. Subsequent angiography demonstrated an excellent result lesion reduction from 75% to 0% with no complicating features. The procedure was performed utilizing aspirin, Brilinta, and heparin.  CONCLUSIONS:  1. Status post drug-eluting stent implantation to the mid RCA with lesion reduction from 75% to 0%  Patient was taken to the ECHO lab for insertion of Linq Monitor on 06/06/15     Assessment and Plan  1. Syncope - long history, extensive prior workup at [facility] few years ago including loop recorder overall benigh. He does have some chronic bradycardia but has not been linked to a syncopal episode - admit 06/2020 syncope appears secondary to hypovolemia. No recurrence. At this time would not repeart any cardiac testing. Encouraged more regular hydration, if recurrences could consider a repeat monitor.  EKG shows SR, bifasciular block  (LAFB,RBBB)        Arnoldo Lenis, M.D.

## 2020-10-30 ENCOUNTER — Other Ambulatory Visit: Payer: Self-pay | Admitting: Internal Medicine

## 2020-10-30 DIAGNOSIS — G8929 Other chronic pain: Secondary | ICD-10-CM

## 2020-10-30 DIAGNOSIS — M545 Low back pain, unspecified: Secondary | ICD-10-CM

## 2020-11-06 ENCOUNTER — Ambulatory Visit
Admission: RE | Admit: 2020-11-06 | Discharge: 2020-11-06 | Disposition: A | Discharge status: Home / Self Care | Payer: Medicare HMO | Source: Ambulatory Visit | Attending: Internal Medicine | Admitting: Internal Medicine

## 2020-11-06 DIAGNOSIS — G8929 Other chronic pain: Secondary | ICD-10-CM

## 2020-11-06 DIAGNOSIS — M47817 Spondylosis without myelopathy or radiculopathy, lumbosacral region: Secondary | ICD-10-CM | POA: Diagnosis not present

## 2020-11-06 DIAGNOSIS — M545 Low back pain, unspecified: Secondary | ICD-10-CM

## 2020-11-06 MED ORDER — IOPAMIDOL (ISOVUE-M 200) INJECTION 41%
1.0000 mL | Freq: Once | INTRAMUSCULAR | Status: AC
  Administered 2020-11-06: 1 mL via EPIDURAL

## 2020-11-06 MED ORDER — METHYLPREDNISOLONE ACETATE 40 MG/ML INJ SUSP (RADIOLOG
120.0000 mg | Freq: Once | INTRAMUSCULAR | Status: AC
  Administered 2020-11-06: 120 mg via EPIDURAL

## 2020-11-06 NOTE — Discharge Instructions (Signed)

## 2021-01-06 DIAGNOSIS — M545 Low back pain, unspecified: Secondary | ICD-10-CM | POA: Diagnosis not present

## 2021-01-06 DIAGNOSIS — Z6825 Body mass index (BMI) 25.0-25.9, adult: Secondary | ICD-10-CM | POA: Diagnosis not present

## 2021-01-06 DIAGNOSIS — R630 Anorexia: Secondary | ICD-10-CM | POA: Diagnosis not present

## 2021-01-06 DIAGNOSIS — I10 Essential (primary) hypertension: Secondary | ICD-10-CM | POA: Diagnosis not present

## 2021-01-06 DIAGNOSIS — E1121 Type 2 diabetes mellitus with diabetic nephropathy: Secondary | ICD-10-CM | POA: Diagnosis not present

## 2021-03-19 DIAGNOSIS — H40013 Open angle with borderline findings, low risk, bilateral: Secondary | ICD-10-CM | POA: Diagnosis not present

## 2021-04-07 DIAGNOSIS — E1121 Type 2 diabetes mellitus with diabetic nephropathy: Secondary | ICD-10-CM | POA: Diagnosis not present

## 2021-04-07 DIAGNOSIS — I10 Essential (primary) hypertension: Secondary | ICD-10-CM | POA: Diagnosis not present

## 2021-04-07 DIAGNOSIS — Z6823 Body mass index (BMI) 23.0-23.9, adult: Secondary | ICD-10-CM | POA: Diagnosis not present

## 2021-04-07 DIAGNOSIS — M545 Low back pain, unspecified: Secondary | ICD-10-CM | POA: Diagnosis not present

## 2021-04-07 DIAGNOSIS — Z1331 Encounter for screening for depression: Secondary | ICD-10-CM | POA: Diagnosis not present

## 2021-04-07 DIAGNOSIS — R630 Anorexia: Secondary | ICD-10-CM | POA: Diagnosis not present

## 2021-04-07 DIAGNOSIS — Z0000 Encounter for general adult medical examination without abnormal findings: Secondary | ICD-10-CM | POA: Diagnosis not present

## 2021-04-29 ENCOUNTER — Ambulatory Visit: Payer: Medicare HMO | Admitting: Cardiology

## 2021-05-18 NOTE — Progress Notes (Signed)
Cardiology Office Note  Date: 05/19/2021   ID: Joe Anderson, DOB 31-Oct-1927, MRN [id number]  PCP:  Joe Burly, MD  Cardiologist:  Joe Breeding, MD Electrophysiologist:  None   Chief Complaint: 55-monthfollow-up  History of Present Illness: Joe HETTICHis a 87y.o. male with a history of CAD, hypertension, AKI, dementia.  He was last seen by Dr. BHarl Bowieon 10/28/2020.  History of syncope.  Previous head CT on October 2021 with no acute process.  Echocardiogram in same month was nondiagnostic.  Had significant acute kidney injury thought to be prerenal which resolved with IV fluids.  Creatinine decreased from 1.7 down to 0.96.  Syncope was thought to have been related to hypovolemia.  [facility]  Previous loop recorder had been removed in November 2019.  Patient had some bradycardia and sinus pauses at night.  History of CAD with PCI to RCA 2016.  Echocardiogram January 2020 EF 55 to 60%.  Patient had no recurrences of syncope.  Plan was at that time not to repeat any cardiac testing.  He was encouraged more regular hydration.  If recurrences could consider repeat monitor.  EKG showed sinus rhythm with a bifascicular block/LAFB/RBBB.  He is here today for 645-monthollow-up.  He is accompanied by his son.  He denies any recurrences of syncope.  Blood pressures well controlled at 118/72.  Heart rate 55.  Current cardiac regimen includes amlodipine, benazepril.  He denies any anginal or exertional symptoms.  He has not very active per son statement.  Son states he tries to get him to walk some to help him to stay somewhat mobile.  He denies any orthostatic symptoms, palpitations or arrhythmias, PND, orthopnea, bleeding.  No claudication-like symptoms, DVT or PE-like symptoms.  He has 2 different listings for benazepril.  1 listing on medication list states benazepril 10 mg daily, the other states 20 mg daily.  Son states she will  call us[place]oreaack when he gets home and let us[place]oreanow which dose he is taking.  Patient is using a quad cane today.  Patient's son states he recently had lab work at PCP office and all the lab work-up was within normal limits.  Past Medical History:  Diagnosis Date   [place]   CAD (coronary artery disease)    stent to RCA (2016)   [place]   Hypertension     Past Surgical History:  Procedure Laterality Date   PERCUTANEOUS CORONARY STENT INTERVENTION (PCI-S)  2016   [facility]    Current Outpatient Medications  Medication Sig Dispense Refill   acetaminophen (TYLENOL) 500 MG tablet Take 500 mg by mouth every 6 (six) hours as needed.     alendronate (FOSAMAX) 70 MG tablet Take 70 mg by mouth once a week.     amLODipine (NORVASC) 10 MG tablet Take 10 mg by mouth daily.     benazepril (LOTENSIN) 10 MG tablet Take 10 mg by mouth daily.     benazepril (LOTENSIN) 20 MG tablet Take by mouth.     donepezil (ARICEPT) 10 MG tablet Take 10 mg by mouth daily.     finasteride (PROSCAR) 5 MG tablet Take 5 mg by mouth daily.     HYDROcodone-acetaminophen (NORCO) 7.5-325 MG tablet Take 1 tablet by mouth 3 (three) times daily as needed.     megestrol (MEGACE) 20 MG tablet Take 20 mg by  mouth daily.     Multiple Vitamin (MULTIVITAMIN) capsule Take by mouth.     Omega-3 Fatty Acids (FISH OIL) 1000 MG CAPS Take by mouth.     No current facility-administered medications for this visit.   Allergies:  Patient has no known allergies.   Social History: The patient  reports that he has quit smoking. He has never used smokeless tobacco.   Family History: The patient's family history is not on file.   ROS:  Please see the history of present illness. Otherwise, complete review of systems is positive for none.  All other systems are reviewed and negative.   Physical Exam: VS:  BP 118/72   Pulse (!) 55   Ht '5\' 6"'$  (1.676 m)   Wt 142 lb 6.4 oz (64.6 kg)   SpO2 100%   BMI 22.98 kg/m  , BMI Body mass index is 22.98 kg/m.  Wt Readings from Last 3 Encounters:  05/19/21 142 lb 6.4 oz (64.6 kg)  10/28/20 157 lb 3.2 oz (71.3 kg)    General: Patient appears comfortable at rest. Neck: Supple, no elevated JVP or carotid bruits, no thyromegaly. Lungs: Clear to auscultation, nonlabored breathing at rest. Cardiac: Regular rate and rhythm, no S3 or significant systolic murmur, no pericardial rub. Extremities: No pitting edema, distal pulses 2+. Skin: Warm and dry. Musculoskeletal: No kyphosis. Neuropsychiatric: Alert and oriented x3, affect grossly appropriate.  ECG:    Recent Labwork: No results found for requested labs within last 8760 hours.  No results found for: CHOL, TRIG, HDL, CHOLHDL, VLDL, LDLCALC, LDLDIRECT  Other Studies Reviewed Today:   05/2015 [facility] cath Left coronary artery: Left main angiographically normal Left anterior descending is a large vessel that wraps the apex. There is a moderate-sized diagonal one vessel. 25% stenosis noted in the mid LAD. Left circumflex artery is a large vessel. There is a high obtuse marginal one/ramus artery that has mild irregularities. Obtuse marginal 2 is a large vessel that bifurcates distally. Mild irregularities noted in the circumflex system  Right coronary artery: Tortuous artery that is dominant and enlarged in size. The site of a vessel band there is a roughly 70% stenosis noted in the midportion of the vessel.  Aortic pressure 167/66 Left ventriculography was not performed as the patient had preserved ejection fraction by echocardiogram and nuclear stress test.   PERCUTANEOUS CORONARY INTERVENTION: From the wrist, an Ikari right guide catheter gave excellent support in the RCA. The 75% lesion was negotiated utilizing a pilot 50 wire. I predilated with a 2.5 x 12 mm trek balloon. Because of the tortuosity a Synergy stent was placed. Subsequent angiography demonstrated an excellent result lesion reduction from 75%  to 0% with no complicating features. The procedure was performed utilizing  aspirin, Brilinta, and heparin.   CONCLUSIONS:   1. Status post drug-eluting stent implantation to the mid RCA with lesion reduction from 75% to 0%  Patient was taken to the ECHO lab for insertion of Linq Monitor on 06/06/15      Assessment and Plan:  1. Syncope, unspecified syncope type   2. Essential hypertension    1. Syncope, unspecified syncope type Patient has had no recurrences of syncope since previous episode.  Heart rate today is 55.  Blood pressure 18/72.  Son who is with him states he is not very active on a daily basis and he has to encourage him to get up and walk some to keep him somewhat mobile. He denies any orthostatic symptoms, lightheadedness, dizziness,  presyncopal or syncopal episodes.  2. Essential hypertension BP today 118/72.  Continue amlodipine 10 mg daily.  Patient has benazepril listed twice on his medication list; 1 is a 20 mg dose, the other is a 10 mg dose.  Son states when he gets home he will let us know which dosage patient is currently taking.  When we receive that information we can reconcile this on our medication list.  Medication Adjustments/Labs and Tests Ordered: Current medicines are reviewed at length with the patient today.  Concerns regarding medicines are outlined above.   Disposition: Follow-up with Dr. Percival Spanish or APP 6 months  Signed, Levell July, NP 05/19/2021 3:43 PM    [facility] [place], [place], [place] [address] Phone: [phone]; Fax: [phone]

## 2021-05-19 ENCOUNTER — Ambulatory Visit: Payer: Medicare HMO | Admitting: Family Medicine

## 2021-05-19 ENCOUNTER — Encounter: Payer: Self-pay | Admitting: Family Medicine

## 2021-05-19 VITALS — BP 118/72 | HR 55 | Ht 66.0 in | Wt 142.4 lb

## 2021-05-19 DIAGNOSIS — I10 Essential (primary) hypertension: Secondary | ICD-10-CM | POA: Diagnosis not present

## 2021-05-19 DIAGNOSIS — R55 Syncope and collapse: Secondary | ICD-10-CM | POA: Diagnosis not present

## 2021-05-19 NOTE — Patient Instructions (Addendum)
Medication Instructions:  Continue all current medications.   Labwork: none  Testing/Procedures: none  Follow-Up: 6 months   Any Other Special Instructions Will Be Listed Below (If Applicable).   If you need a refill on your cardiac medications before your next appointment, please call your pharmacy.  

## 2021-05-27 DIAGNOSIS — I10 Essential (primary) hypertension: Secondary | ICD-10-CM | POA: Diagnosis not present

## 2021-05-27 DIAGNOSIS — M545 Low back pain, unspecified: Secondary | ICD-10-CM | POA: Diagnosis not present

## 2021-05-27 DIAGNOSIS — R630 Anorexia: Secondary | ICD-10-CM | POA: Diagnosis not present

## 2021-05-27 DIAGNOSIS — Z1331 Encounter for screening for depression: Secondary | ICD-10-CM | POA: Diagnosis not present

## 2021-05-27 DIAGNOSIS — Z6823 Body mass index (BMI) 23.0-23.9, adult: Secondary | ICD-10-CM | POA: Diagnosis not present

## 2021-05-27 DIAGNOSIS — E1121 Type 2 diabetes mellitus with diabetic nephropathy: Secondary | ICD-10-CM | POA: Diagnosis not present

## 2021-06-26 DIAGNOSIS — I10 Essential (primary) hypertension: Secondary | ICD-10-CM | POA: Diagnosis not present

## 2021-06-26 DIAGNOSIS — M545 Low back pain, unspecified: Secondary | ICD-10-CM | POA: Diagnosis not present

## 2021-06-26 DIAGNOSIS — E1121 Type 2 diabetes mellitus with diabetic nephropathy: Secondary | ICD-10-CM | POA: Diagnosis not present

## 2021-07-08 DIAGNOSIS — Z0000 Encounter for general adult medical examination without abnormal findings: Secondary | ICD-10-CM | POA: Diagnosis not present

## 2021-07-08 DIAGNOSIS — M545 Low back pain, unspecified: Secondary | ICD-10-CM | POA: Diagnosis not present

## 2021-07-08 DIAGNOSIS — E1121 Type 2 diabetes mellitus with diabetic nephropathy: Secondary | ICD-10-CM | POA: Diagnosis not present

## 2021-07-08 DIAGNOSIS — Z6824 Body mass index (BMI) 24.0-24.9, adult: Secondary | ICD-10-CM | POA: Diagnosis not present

## 2021-07-08 DIAGNOSIS — I10 Essential (primary) hypertension: Secondary | ICD-10-CM | POA: Diagnosis not present

## 2021-07-08 DIAGNOSIS — R630 Anorexia: Secondary | ICD-10-CM | POA: Diagnosis not present

## 2021-07-08 DIAGNOSIS — Z1331 Encounter for screening for depression: Secondary | ICD-10-CM | POA: Diagnosis not present

## 2021-08-10 DIAGNOSIS — Z6823 Body mass index (BMI) 23.0-23.9, adult: Secondary | ICD-10-CM | POA: Diagnosis not present

## 2021-08-10 DIAGNOSIS — R1310 Dysphagia, unspecified: Secondary | ICD-10-CM | POA: Diagnosis not present

## 2021-09-24 DIAGNOSIS — H524 Presbyopia: Secondary | ICD-10-CM | POA: Diagnosis not present

## 2021-09-24 DIAGNOSIS — H40013 Open angle with borderline findings, low risk, bilateral: Secondary | ICD-10-CM | POA: Diagnosis not present

## 2021-09-24 DIAGNOSIS — Z9849 Cataract extraction status, unspecified eye: Secondary | ICD-10-CM | POA: Diagnosis not present

## 2021-09-24 DIAGNOSIS — H35033 Hypertensive retinopathy, bilateral: Secondary | ICD-10-CM | POA: Diagnosis not present

## 2021-09-24 DIAGNOSIS — H04123 Dry eye syndrome of bilateral lacrimal glands: Secondary | ICD-10-CM | POA: Diagnosis not present

## 2021-09-24 DIAGNOSIS — H35373 Puckering of macula, bilateral: Secondary | ICD-10-CM | POA: Diagnosis not present

## 2021-09-24 DIAGNOSIS — Z961 Presence of intraocular lens: Secondary | ICD-10-CM | POA: Diagnosis not present

## 2021-09-24 DIAGNOSIS — H18413 Arcus senilis, bilateral: Secondary | ICD-10-CM | POA: Diagnosis not present

## 2021-09-25 DIAGNOSIS — Z0100 Encounter for examination of eyes and vision without abnormal findings: Secondary | ICD-10-CM | POA: Diagnosis not present

## 2021-09-25 DIAGNOSIS — I10 Essential (primary) hypertension: Secondary | ICD-10-CM | POA: Diagnosis not present

## 2021-09-25 DIAGNOSIS — M545 Low back pain, unspecified: Secondary | ICD-10-CM | POA: Diagnosis not present

## 2021-09-25 DIAGNOSIS — E1121 Type 2 diabetes mellitus with diabetic nephropathy: Secondary | ICD-10-CM | POA: Diagnosis not present

## 2021-09-27 IMAGING — XA Imaging study
2 series · 2 of 2 positions shown · non-contrast
Comparison: none

CLINICAL DATA: Lumbosacral spondylosis without myelopathy. Chronic
low back and right greater than left leg pain. 50-60% relief after
prior injections.

[Series 2: ortho standard · 1 of 1 slices shown (1 of 2)]
[im 1/1]
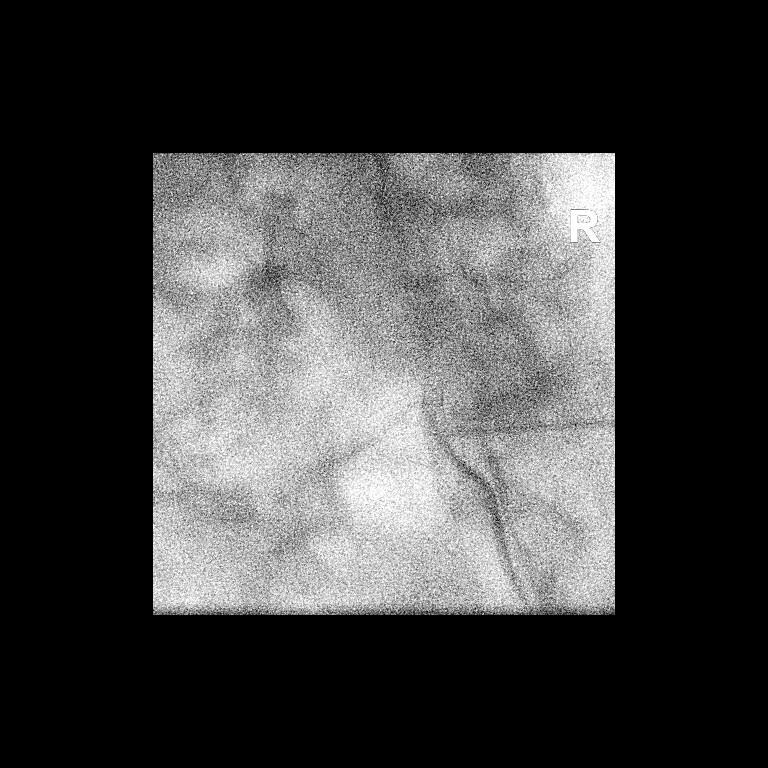

[Series 3: ortho standard · 1 of 1 slices shown (2 of 2)]
[im 1/1]
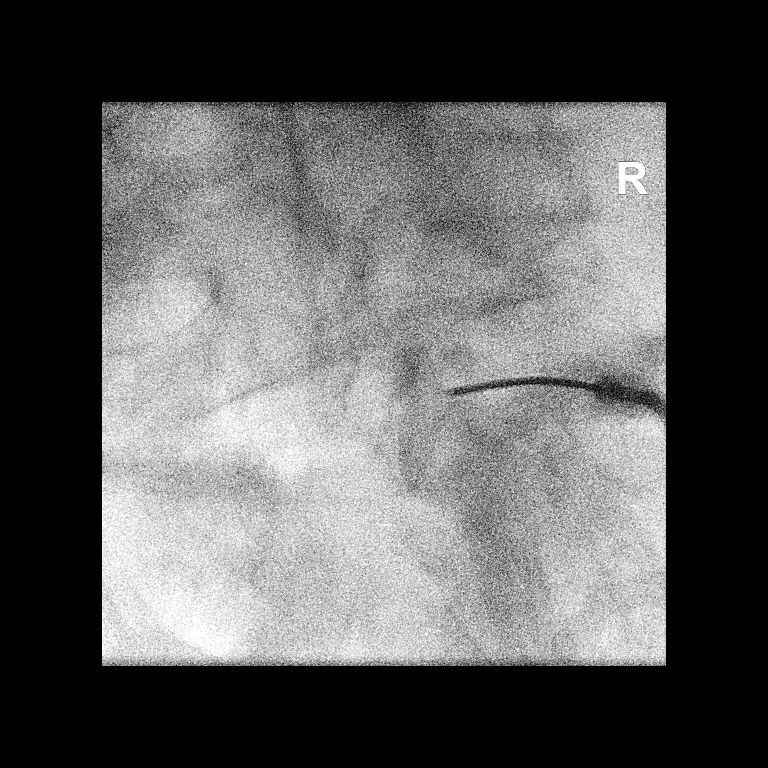

[2 of 2 positions shown; findings below may reference images not displayed]

FLUOROSCOPY TIME:  Radiation Exposure Index (as provided by the
fluoroscopic device): 4.1 mGy

Fluoroscopy Time:  14 seconds

Number of Acquired Images:  0

PROCEDURE:
The procedure, risks, benefits, and alternatives were explained to
the patient. Questions regarding the procedure were encouraged and
answered. The patient understands and consents to the procedure.

LUMBAR EPIDURAL INJECTION:

An interlaminar approach was performed on the right at L5-S1. The
overlying skin was cleansed and anesthetized. A 3.5 inch 20 gauge
epidural needle was advanced using loss-of-resistance technique.

DIAGNOSTIC EPIDURAL INJECTION:

Injection of Isovue-M 200 shows a good epidural pattern with spread
above and below the level of needle placement, primarily on the
right. No vascular opacification is seen.

THERAPEUTIC EPIDURAL INJECTION:

120 mg of Depo-Medrol mixed with 3 mL of 1% lidocaine were
instilled. The procedure was well-tolerated, and the patient was
discharged thirty minutes following the injection in good condition.

COMPLICATIONS:
None immediate.
IMPRESSION: Technically successful interlaminar epidural injection on the right
at L5-S1.

## 2021-10-29 DIAGNOSIS — H35372 Puckering of macula, left eye: Secondary | ICD-10-CM | POA: Diagnosis not present

## 2021-10-29 DIAGNOSIS — H40001 Preglaucoma, unspecified, right eye: Secondary | ICD-10-CM | POA: Diagnosis not present

## 2021-10-29 DIAGNOSIS — H40002 Preglaucoma, unspecified, left eye: Secondary | ICD-10-CM | POA: Diagnosis not present

## 2021-10-29 DIAGNOSIS — H35371 Puckering of macula, right eye: Secondary | ICD-10-CM | POA: Diagnosis not present

## 2021-10-29 DIAGNOSIS — H18413 Arcus senilis, bilateral: Secondary | ICD-10-CM | POA: Diagnosis not present

## 2021-10-29 DIAGNOSIS — H524 Presbyopia: Secondary | ICD-10-CM | POA: Diagnosis not present

## 2021-10-29 DIAGNOSIS — Z961 Presence of intraocular lens: Secondary | ICD-10-CM | POA: Diagnosis not present

## 2021-10-29 DIAGNOSIS — H35341 Macular cyst, hole, or pseudohole, right eye: Secondary | ICD-10-CM | POA: Diagnosis not present

## 2021-11-12 DIAGNOSIS — M545 Low back pain, unspecified: Secondary | ICD-10-CM | POA: Diagnosis not present

## 2021-11-12 DIAGNOSIS — E1143 Type 2 diabetes mellitus with diabetic autonomic (poly)neuropathy: Secondary | ICD-10-CM | POA: Diagnosis not present

## 2021-11-12 DIAGNOSIS — Z6824 Body mass index (BMI) 24.0-24.9, adult: Secondary | ICD-10-CM | POA: Diagnosis not present

## 2021-11-12 DIAGNOSIS — R69 Illness, unspecified: Secondary | ICD-10-CM | POA: Diagnosis not present

## 2021-11-12 DIAGNOSIS — I10 Essential (primary) hypertension: Secondary | ICD-10-CM | POA: Diagnosis not present

## 2021-11-12 DIAGNOSIS — G250 Essential tremor: Secondary | ICD-10-CM | POA: Diagnosis not present

## 2021-12-21 DIAGNOSIS — I69351 Hemiplegia and hemiparesis following cerebral infarction affecting right dominant side: Secondary | ICD-10-CM | POA: Diagnosis not present

## 2021-12-21 DIAGNOSIS — N529 Male erectile dysfunction, unspecified: Secondary | ICD-10-CM | POA: Diagnosis not present

## 2021-12-21 DIAGNOSIS — E785 Hyperlipidemia, unspecified: Secondary | ICD-10-CM | POA: Diagnosis not present

## 2021-12-21 DIAGNOSIS — M810 Age-related osteoporosis without current pathological fracture: Secondary | ICD-10-CM | POA: Diagnosis not present

## 2021-12-21 DIAGNOSIS — G8929 Other chronic pain: Secondary | ICD-10-CM | POA: Diagnosis not present

## 2021-12-21 DIAGNOSIS — I69318 Other symptoms and signs involving cognitive functions following cerebral infarction: Secondary | ICD-10-CM | POA: Diagnosis not present

## 2021-12-21 DIAGNOSIS — N400 Enlarged prostate without lower urinary tract symptoms: Secondary | ICD-10-CM | POA: Diagnosis not present

## 2021-12-21 DIAGNOSIS — M1990 Unspecified osteoarthritis, unspecified site: Secondary | ICD-10-CM | POA: Diagnosis not present

## 2021-12-21 DIAGNOSIS — Z5982 Transportation insecurity: Secondary | ICD-10-CM | POA: Diagnosis not present

## 2021-12-21 DIAGNOSIS — I10 Essential (primary) hypertension: Secondary | ICD-10-CM | POA: Diagnosis not present

## 2021-12-21 DIAGNOSIS — R69 Illness, unspecified: Secondary | ICD-10-CM | POA: Diagnosis not present

## 2021-12-21 DIAGNOSIS — I69328 Other speech and language deficits following cerebral infarction: Secondary | ICD-10-CM | POA: Diagnosis not present

## 2021-12-30 DIAGNOSIS — Z6823 Body mass index (BMI) 23.0-23.9, adult: Secondary | ICD-10-CM | POA: Diagnosis not present

## 2021-12-30 DIAGNOSIS — I872 Venous insufficiency (chronic) (peripheral): Secondary | ICD-10-CM | POA: Diagnosis not present

## 2022-01-01 DIAGNOSIS — I872 Venous insufficiency (chronic) (peripheral): Secondary | ICD-10-CM | POA: Diagnosis not present

## 2022-01-01 DIAGNOSIS — M545 Low back pain, unspecified: Secondary | ICD-10-CM | POA: Diagnosis not present

## 2022-01-27 DIAGNOSIS — S0101XA Laceration without foreign body of scalp, initial encounter: Secondary | ICD-10-CM | POA: Diagnosis not present

## 2022-01-27 DIAGNOSIS — R69 Illness, unspecified: Secondary | ICD-10-CM | POA: Diagnosis not present

## 2022-01-27 DIAGNOSIS — W268XXA Contact with other sharp object(s), not elsewhere classified, initial encounter: Secondary | ICD-10-CM | POA: Diagnosis not present

## 2022-01-27 DIAGNOSIS — I10 Essential (primary) hypertension: Secondary | ICD-10-CM | POA: Diagnosis not present

## 2022-01-27 DIAGNOSIS — Z91041 Radiographic dye allergy status: Secondary | ICD-10-CM | POA: Diagnosis not present

## 2022-01-27 DIAGNOSIS — I2510 Atherosclerotic heart disease of native coronary artery without angina pectoris: Secondary | ICD-10-CM | POA: Diagnosis not present

## 2022-01-27 DIAGNOSIS — S0990XA Unspecified injury of head, initial encounter: Secondary | ICD-10-CM | POA: Diagnosis not present

## 2022-01-27 DIAGNOSIS — W19XXXA Unspecified fall, initial encounter: Secondary | ICD-10-CM | POA: Diagnosis not present

## 2022-01-31 DIAGNOSIS — I872 Venous insufficiency (chronic) (peripheral): Secondary | ICD-10-CM | POA: Diagnosis not present

## 2022-01-31 DIAGNOSIS — M545 Low back pain, unspecified: Secondary | ICD-10-CM | POA: Diagnosis not present

## 2022-02-04 DIAGNOSIS — S0091XA Abrasion of unspecified part of head, initial encounter: Secondary | ICD-10-CM | POA: Diagnosis not present

## 2022-02-04 DIAGNOSIS — Z6823 Body mass index (BMI) 23.0-23.9, adult: Secondary | ICD-10-CM | POA: Diagnosis not present

## 2022-02-24 DIAGNOSIS — E1143 Type 2 diabetes mellitus with diabetic autonomic (poly)neuropathy: Secondary | ICD-10-CM | POA: Diagnosis not present

## 2022-02-24 DIAGNOSIS — I10 Essential (primary) hypertension: Secondary | ICD-10-CM | POA: Diagnosis not present

## 2022-03-03 DIAGNOSIS — I872 Venous insufficiency (chronic) (peripheral): Secondary | ICD-10-CM | POA: Diagnosis not present

## 2022-03-03 DIAGNOSIS — M545 Low back pain, unspecified: Secondary | ICD-10-CM | POA: Diagnosis not present

## 2022-03-09 DIAGNOSIS — Z6822 Body mass index (BMI) 22.0-22.9, adult: Secondary | ICD-10-CM | POA: Diagnosis not present

## 2022-03-09 DIAGNOSIS — M62838 Other muscle spasm: Secondary | ICD-10-CM | POA: Diagnosis not present

## 2022-04-02 DIAGNOSIS — I872 Venous insufficiency (chronic) (peripheral): Secondary | ICD-10-CM | POA: Diagnosis not present

## 2022-04-02 DIAGNOSIS — M545 Low back pain, unspecified: Secondary | ICD-10-CM | POA: Diagnosis not present

## 2022-05-03 DIAGNOSIS — I872 Venous insufficiency (chronic) (peripheral): Secondary | ICD-10-CM | POA: Diagnosis not present

## 2022-05-03 DIAGNOSIS — M545 Low back pain, unspecified: Secondary | ICD-10-CM | POA: Diagnosis not present

## 2022-06-03 DIAGNOSIS — I872 Venous insufficiency (chronic) (peripheral): Secondary | ICD-10-CM | POA: Diagnosis not present

## 2022-06-03 DIAGNOSIS — M545 Low back pain, unspecified: Secondary | ICD-10-CM | POA: Diagnosis not present

## 2022-06-24 DIAGNOSIS — I10 Essential (primary) hypertension: Secondary | ICD-10-CM | POA: Diagnosis not present

## 2022-06-24 DIAGNOSIS — R69 Illness, unspecified: Secondary | ICD-10-CM | POA: Diagnosis not present

## 2022-06-24 DIAGNOSIS — N183 Chronic kidney disease, stage 3 unspecified: Secondary | ICD-10-CM | POA: Diagnosis not present

## 2022-06-24 DIAGNOSIS — Z6822 Body mass index (BMI) 22.0-22.9, adult: Secondary | ICD-10-CM | POA: Diagnosis not present

## 2022-06-24 DIAGNOSIS — E1143 Type 2 diabetes mellitus with diabetic autonomic (poly)neuropathy: Secondary | ICD-10-CM | POA: Diagnosis not present

## 2022-06-24 DIAGNOSIS — M62838 Other muscle spasm: Secondary | ICD-10-CM | POA: Diagnosis not present

## 2022-07-03 DIAGNOSIS — I872 Venous insufficiency (chronic) (peripheral): Secondary | ICD-10-CM | POA: Diagnosis not present

## 2022-07-03 DIAGNOSIS — M545 Low back pain, unspecified: Secondary | ICD-10-CM | POA: Diagnosis not present

## 2022-07-08 DIAGNOSIS — H35373 Puckering of macula, bilateral: Secondary | ICD-10-CM | POA: Diagnosis not present

## 2022-07-26 DIAGNOSIS — R64 Cachexia: Secondary | ICD-10-CM | POA: Diagnosis not present

## 2022-07-26 DIAGNOSIS — E870 Hyperosmolality and hypernatremia: Secondary | ICD-10-CM | POA: Diagnosis not present

## 2022-07-26 DIAGNOSIS — I10 Essential (primary) hypertension: Secondary | ICD-10-CM | POA: Diagnosis not present

## 2022-07-26 DIAGNOSIS — N179 Acute kidney failure, unspecified: Secondary | ICD-10-CM | POA: Diagnosis not present

## 2022-07-26 DIAGNOSIS — R638 Other symptoms and signs concerning food and fluid intake: Secondary | ICD-10-CM | POA: Diagnosis not present

## 2022-07-26 DIAGNOSIS — E860 Dehydration: Secondary | ICD-10-CM | POA: Diagnosis not present

## 2022-07-26 DIAGNOSIS — R2681 Unsteadiness on feet: Secondary | ICD-10-CM | POA: Diagnosis not present

## 2022-07-26 DIAGNOSIS — R54 Age-related physical debility: Secondary | ICD-10-CM | POA: Diagnosis not present

## 2022-07-26 DIAGNOSIS — Z885 Allergy status to narcotic agent status: Secondary | ICD-10-CM | POA: Diagnosis not present

## 2022-07-26 DIAGNOSIS — Z66 Do not resuscitate: Secondary | ICD-10-CM | POA: Diagnosis not present

## 2022-07-26 DIAGNOSIS — G9341 Metabolic encephalopathy: Secondary | ICD-10-CM | POA: Diagnosis not present

## 2022-07-26 DIAGNOSIS — Z6821 Body mass index (BMI) 21.0-21.9, adult: Secondary | ICD-10-CM | POA: Diagnosis not present

## 2022-07-26 DIAGNOSIS — Z91041 Radiographic dye allergy status: Secondary | ICD-10-CM | POA: Diagnosis not present

## 2022-07-26 DIAGNOSIS — I2510 Atherosclerotic heart disease of native coronary artery without angina pectoris: Secondary | ICD-10-CM | POA: Diagnosis not present

## 2022-07-26 DIAGNOSIS — R4182 Altered mental status, unspecified: Secondary | ICD-10-CM | POA: Diagnosis not present

## 2022-07-26 DIAGNOSIS — I252 Old myocardial infarction: Secondary | ICD-10-CM | POA: Diagnosis not present

## 2022-07-26 DIAGNOSIS — M1990 Unspecified osteoarthritis, unspecified site: Secondary | ICD-10-CM | POA: Diagnosis not present

## 2022-07-26 DIAGNOSIS — E872 Acidosis, unspecified: Secondary | ICD-10-CM | POA: Diagnosis not present

## 2022-07-26 DIAGNOSIS — R69 Illness, unspecified: Secondary | ICD-10-CM | POA: Diagnosis not present

## 2022-07-26 DIAGNOSIS — E440 Moderate protein-calorie malnutrition: Secondary | ICD-10-CM | POA: Diagnosis not present

## 2022-07-27 DIAGNOSIS — R4182 Altered mental status, unspecified: Secondary | ICD-10-CM | POA: Diagnosis not present

## 2022-07-30 DIAGNOSIS — G9341 Metabolic encephalopathy: Secondary | ICD-10-CM | POA: Diagnosis not present

## 2022-07-30 DIAGNOSIS — R69 Illness, unspecified: Secondary | ICD-10-CM | POA: Diagnosis not present

## 2022-07-30 DIAGNOSIS — Z79899 Other long term (current) drug therapy: Secondary | ICD-10-CM | POA: Diagnosis not present

## 2022-07-30 DIAGNOSIS — I10 Essential (primary) hypertension: Secondary | ICD-10-CM | POA: Diagnosis not present

## 2022-07-30 DIAGNOSIS — E860 Dehydration: Secondary | ICD-10-CM | POA: Diagnosis not present

## 2022-07-30 DIAGNOSIS — I2510 Atherosclerotic heart disease of native coronary artery without angina pectoris: Secondary | ICD-10-CM | POA: Diagnosis not present

## 2022-07-30 DIAGNOSIS — N179 Acute kidney failure, unspecified: Secondary | ICD-10-CM | POA: Diagnosis not present

## 2022-07-30 DIAGNOSIS — E440 Moderate protein-calorie malnutrition: Secondary | ICD-10-CM | POA: Diagnosis not present

## 2022-07-30 DIAGNOSIS — E870 Hyperosmolality and hypernatremia: Secondary | ICD-10-CM | POA: Diagnosis not present

## 2022-08-03 DIAGNOSIS — I872 Venous insufficiency (chronic) (peripheral): Secondary | ICD-10-CM | POA: Diagnosis not present

## 2022-08-03 DIAGNOSIS — M545 Low back pain, unspecified: Secondary | ICD-10-CM | POA: Diagnosis not present

## 2022-09-02 DIAGNOSIS — I872 Venous insufficiency (chronic) (peripheral): Secondary | ICD-10-CM | POA: Diagnosis not present

## 2022-09-02 DIAGNOSIS — M545 Low back pain, unspecified: Secondary | ICD-10-CM | POA: Diagnosis not present

## 2022-11-10 IMAGING — XA Imaging study
3 series · 3 of 3 positions shown · non-contrast
Comparison: none

CLINICAL DATA: [AGE] male with lumbosacral spondylosis
without myelopathy. He has low back and bilateral lower extremity
pain. He has experienced partial relief from prior epidural
injections. Today, his pain is more on the left than the right.

[Series 2: ortho standard · 1 of 1 slices shown (1 of 3)]
[im 1/1]
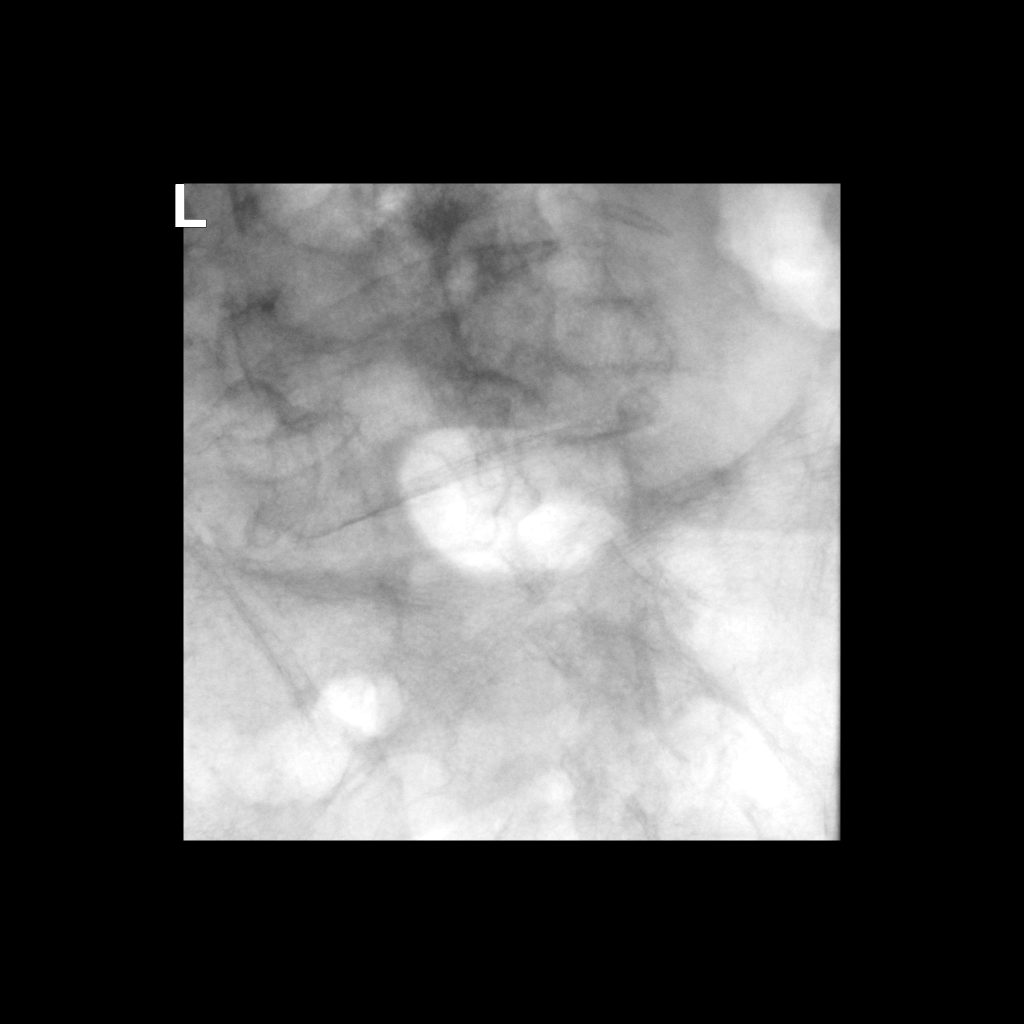

[Series 3: ortho standard · 1 of 1 slices shown (2 of 3)]
[im 1/1]
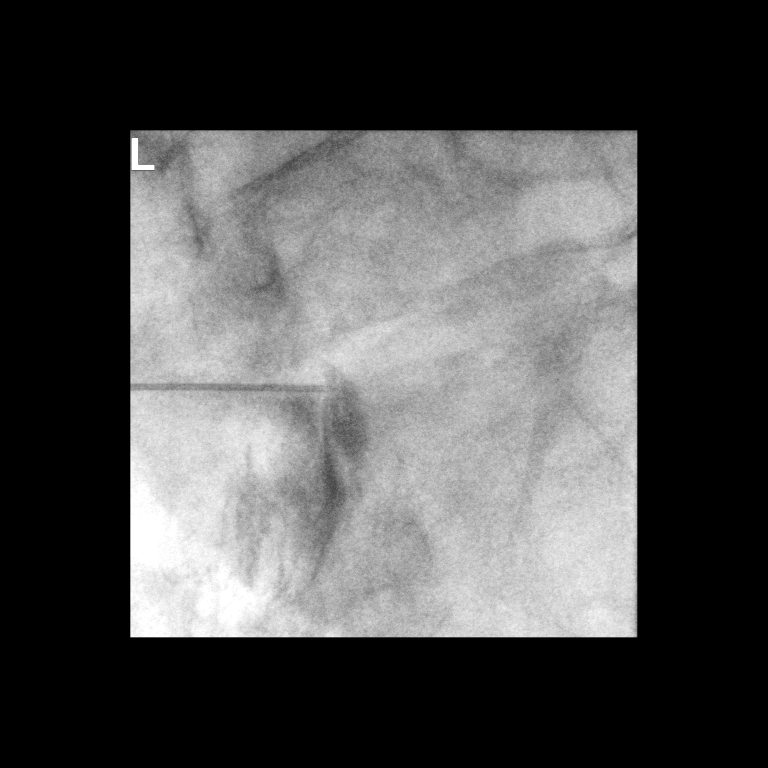

[Series 4: ortho standard · 1 of 1 slices shown (3 of 3)]
[im 1/1]
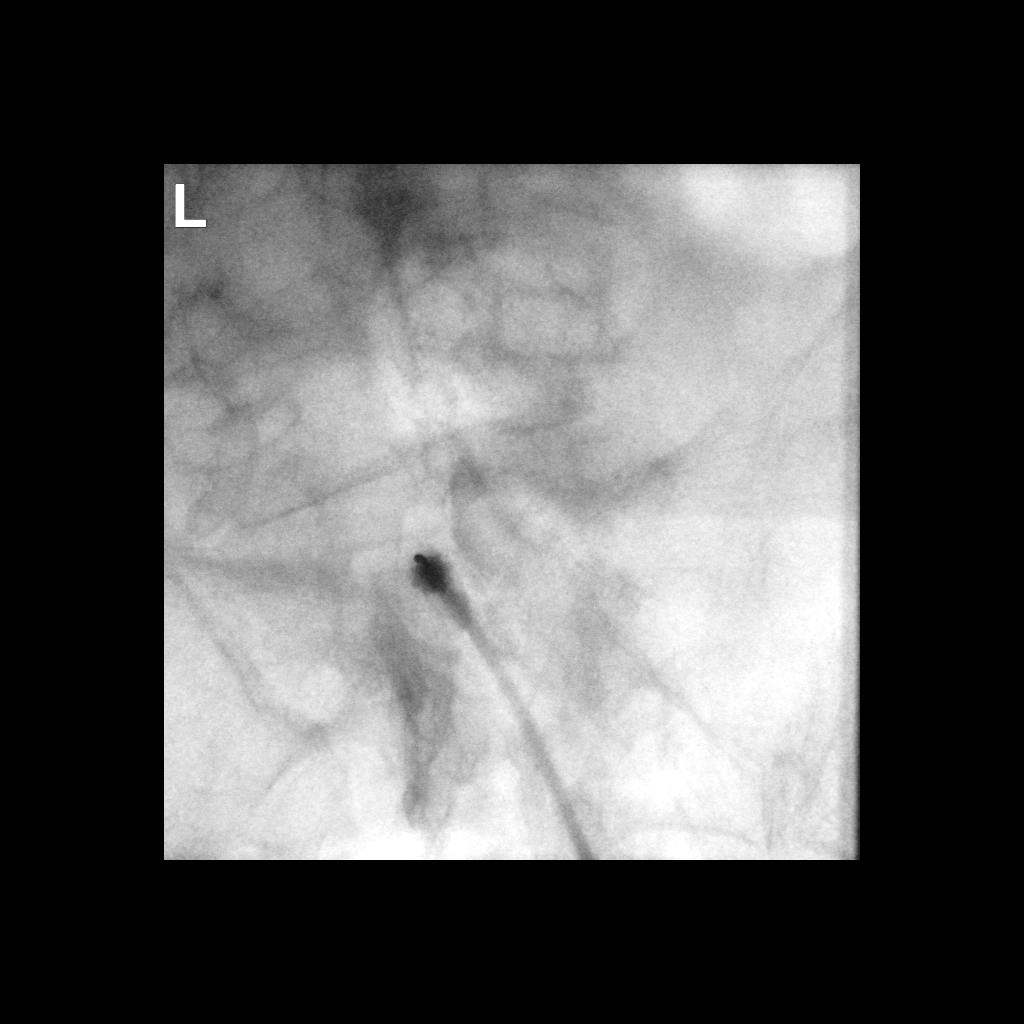

[3 of 3 positions shown; findings below may reference images not displayed]

FLUOROSCOPY TIME:  0 minutes 55 seconds 10.4 mGy

PROCEDURE:
The procedure, risks, benefits, and alternatives were explained to
the patient. Questions regarding the procedure were encouraged and
answered. The patient understands and consents to the procedure.

LUMBAR EPIDURAL INJECTION:

An interlaminar approach was performed on right at L5-S1. The
overlying skin was cleansed and anesthetized. A 20 gauge epidural
needle was advanced using loss-of-resistance technique.

DIAGNOSTIC EPIDURAL INJECTION:

Injection of Isovue-M 200 shows a good epidural pattern with spread
above and below the level of needle placement, primarily on the
right no vascular opacification is seen.

THERAPEUTIC EPIDURAL INJECTION:

120 mg of Depo-Medrol mixed with 3 mL 1% lidocaine. Were instilled.
The procedure was well-tolerated, and the patient was discharged
thirty minutes following the injection in good condition.

COMPLICATIONS:
None immediate
IMPRESSION: Technically successful epidural injection on the right L5-S1.
